# Patient Record
Sex: Female | Born: 1995 | Race: Black or African American | Hispanic: No | Marital: Single | Smoking: Never smoker
Health system: Southern US, Community
[De-identification: ages and names within clinical notes are randomized; demographics above are authoritative.]

## PROBLEM LIST (undated history)

## (undated) DIAGNOSIS — J45909 Unspecified asthma, uncomplicated: Secondary | ICD-10-CM

## (undated) HISTORY — DX: Unspecified asthma, uncomplicated: J45.909

---
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
## 2016-10-10 DIAGNOSIS — J45909 Unspecified asthma, uncomplicated: Secondary | ICD-10-CM | POA: Insufficient documentation

## 2016-11-21 DIAGNOSIS — J302 Other seasonal allergic rhinitis: Secondary | ICD-10-CM | POA: Insufficient documentation

## 2020-05-04 ENCOUNTER — Ambulatory Visit: Payer: Medicaid Other

## 2020-05-04 DIAGNOSIS — Z3490 Encounter for supervision of normal pregnancy, unspecified, unspecified trimester: Secondary | ICD-10-CM | POA: Insufficient documentation

## 2020-05-04 HISTORY — DX: Encounter for supervision of normal pregnancy, unspecified, unspecified trimester: Z34.90

## 2020-05-04 MED ORDER — BLOOD PRESSURE MONITOR KIT: 1.0000 | PACK | 0 refills | Status: DC

## 2020-05-04 NOTE — Progress Notes (Signed)
PRENATAL INTAKE SUMMARY  Autumn Morgan presents today New OB Nurse Interview.  OB History   No obstetric history on file.    I have reviewed the patient's medical, obstetrical, social, and family histories, medications, and available lab results.  SUBJECTIVE She has no unusual complaints.  OBJECTIVE Initial Physical Exam (New OB)     ASSESSMENT Normal pregnancy  PLAN Prenatal care B/P Cuff

## 2020-05-10 NOTE — Progress Notes (Signed)
Patient ID: Autumn Morgan, female   DOB: Feb 03, 1996, 24 y.o.   MRN: [id number] Patient was assessed and managed by nursing staff during this encounter. I have reviewed the chart and agree with the documentation and plan. I have also made any necessary editorial changes.  Scheryl Darter, MD 05/10/2020 11:42 PM

## 2020-05-11 ENCOUNTER — Encounter: Payer: Self-pay | Admitting: Obstetrics

## 2020-05-11 ENCOUNTER — Other Ambulatory Visit (HOSPITAL_COMMUNITY)
Admission: RE | Admit: 2020-05-11 | Discharge: 2020-05-11 | Disposition: A | Discharge status: Home / Self Care | Payer: BC Managed Care – PPO | Source: Ambulatory Visit | Attending: Obstetrics | Admitting: Obstetrics

## 2020-05-11 ENCOUNTER — Other Ambulatory Visit: Payer: Self-pay

## 2020-05-11 ENCOUNTER — Ambulatory Visit (INDEPENDENT_AMBULATORY_CARE_PROVIDER_SITE_OTHER): Payer: BC Managed Care – PPO | Admitting: Obstetrics

## 2020-05-11 VITALS — BP 107/66 | HR 65 | Ht 69.0 in | Wt 174.0 lb

## 2020-05-11 DIAGNOSIS — Z3490 Encounter for supervision of normal pregnancy, unspecified, unspecified trimester: Secondary | ICD-10-CM

## 2020-05-11 DIAGNOSIS — Z3482 Encounter for supervision of other normal pregnancy, second trimester: Secondary | ICD-10-CM

## 2020-05-11 DIAGNOSIS — Z3A14 14 weeks gestation of pregnancy: Secondary | ICD-10-CM

## 2020-05-11 MED ORDER — VITAFOL ULTRA 29-0.6-0.4-200 MG PO CAPS: 1.0000 | ORAL_CAPSULE | Freq: Every day | ORAL | 4 refills | Status: DC

## 2020-05-11 NOTE — Progress Notes (Signed)
New OB, c/o frequent headaches 3-7/10 x 2 weeks, nausea.

## 2020-05-11 NOTE — Progress Notes (Signed)
Subjective:  Autumn Morgan is a 24 y.o. G4P0030 at [redacted]w[redacted]d being seen today for ongoing prenatal care.  She is currently monitored for the following issues for this low-risk pregnancy and has ABSCESS, PULMONARY and Supervision of normal pregnancy, antepartum on their problem list.  Patient reports no complaints.  Contractions: Not present. Vag. Bleeding: None.   . Denies leaking of fluid.   The following portions of the patient's history were reviewed and updated as appropriate: allergies, current medications, past family history, past medical history, past social history, past surgical history and problem list. Problem list updated.  Objective:   Vitals:   05/11/20 0927 05/11/20 0936  BP: 107/66   Pulse: 65   Weight: 174 lb (78.9 kg)   Height:  5\' 9"  (1.753 m)    Fetal Status:           General:  Alert, oriented and cooperative. Patient is in no acute distress.  Skin: Skin is warm and dry. No rash noted.   Cardiovascular: Normal heart rate noted  Respiratory: Normal respiratory effort, no problems with respiration noted  Abdomen: Soft, gravid, appropriate for gestational age. Pain/Pressure: Absent     Pelvic:  Cervical exam deferred        Extremities: Normal range of motion.  Edema: None  Mental Status: Normal mood and affect. Normal behavior. Normal judgment and thought content.   Urinalysis:      Assessment and Plan:  Pregnancy: G4P0030 at [redacted]w[redacted]d  1. Encounter for supervision of normal pregnancy, antepartum, unspecified gravidity Rx: - Cytology - PAP( [place]) - Cervicovaginal ancillary only( [place]) - Enroll Patient in Babyscripts - Babyscripts Schedule Optimization - Genetic Screening - Culture, OB Urine - CBC/D/Plt+RPR+Rh+ABO+Rub Ab... - [redacted]w[redacted]d MFM OB COMP + 14 WK; Future  Preterm labor symptoms and general obstetric precautions including but not limited to vaginal bleeding, contractions, leaking of fluid and fetal movement were reviewed in detail with the  patient. Please refer to After Visit Summary for other counseling recommendations.   Return in about 4 weeks (around 06/08/2020) for MyChart.   06/10/2020, MD  05/11/20

## 2020-05-11 NOTE — Addendum Note (Signed)
Addended by: Coral Ceo A on: 05/11/2020 10:12 AM   Modules accepted: Orders

## 2020-05-12 LAB — CBC/D/PLT+RPR+RH+ABO+RUB AB...
Antibody Screen: NEGATIVE
Basophils Absolute: 0 10*3/uL (ref 0.0–0.2)
Basos: 0 %
EOS (ABSOLUTE): 0.1 10*3/uL (ref 0.0–0.4)
Eos: 2 %
HCV Ab: 0.2 s/co ratio (ref 0.0–0.9)
HIV Screen 4th Generation wRfx: NONREACTIVE
Hematocrit: 32.3 % — ABNORMAL LOW (ref 34.0–46.6)
Hemoglobin: 10.3 g/dL — ABNORMAL LOW (ref 11.1–15.9)
Hepatitis B Surface Ag: NEGATIVE
Immature Grans (Abs): 0 10*3/uL (ref 0.0–0.1)
Immature Granulocytes: 1 %
Lymphocytes Absolute: 1.6 10*3/uL (ref 0.7–3.1)
Lymphs: 25 %
MCH: 26.9 pg (ref 26.6–33.0)
MCHC: 31.9 g/dL (ref 31.5–35.7)
MCV: 84 fL (ref 79–97)
Monocytes Absolute: 0.4 10*3/uL (ref 0.1–0.9)
Monocytes: 6 %
Neutrophils Absolute: 4.2 10*3/uL (ref 1.4–7.0)
Neutrophils: 66 %
Platelets: 266 10*3/uL (ref 150–450)
RBC: 3.83 x10E6/uL (ref 3.77–5.28)
RDW: 17.9 % — ABNORMAL HIGH (ref 11.7–15.4)
RPR Ser Ql: NONREACTIVE
Rh Factor: NEGATIVE
Rubella Antibodies, IGG: 15.1 index (ref >0.99)
WBC: 6.4 10*3/uL (ref 3.4–10.8)

## 2020-05-12 LAB — CERVICOVAGINAL ANCILLARY ONLY
Bacterial Vaginitis (gardnerella): NEGATIVE
Candida Glabrata: NEGATIVE
Candida Vaginitis: NEGATIVE
Chlamydia: NEGATIVE
Comment: NEGATIVE
Comment: NEGATIVE
Comment: NEGATIVE
Comment: NEGATIVE
Comment: NEGATIVE
Comment: NORMAL
Neisseria Gonorrhea: NEGATIVE
Trichomonas: NEGATIVE

## 2020-05-12 LAB — HCV INTERPRETATION

## 2020-05-13 ENCOUNTER — Other Ambulatory Visit: Payer: Self-pay | Admitting: Obstetrics

## 2020-05-13 ENCOUNTER — Telehealth: Payer: Self-pay

## 2020-05-13 DIAGNOSIS — O99019 Anemia complicating pregnancy, unspecified trimester: Secondary | ICD-10-CM

## 2020-05-13 MED ORDER — FERROUS SULFATE 325 (65 FE) MG PO TABS: 325.0000 mg | ORAL_TABLET | Freq: Two times a day (BID) | ORAL | 5 refills | Status: DC

## 2020-05-13 NOTE — Telephone Encounter (Signed)
S/w pt and advised of results and rx sent. 

## 2020-05-14 LAB — CYTOLOGY - PAP
Comment: NEGATIVE
Diagnosis: NEGATIVE
Diagnosis: REACTIVE
High risk HPV: NEGATIVE

## 2020-05-17 LAB — URINE CULTURE, OB REFLEX

## 2020-05-17 LAB — CULTURE, OB URINE

## 2020-05-18 ENCOUNTER — Encounter: Payer: Self-pay | Admitting: Obstetrics

## 2020-05-19 ENCOUNTER — Other Ambulatory Visit: Payer: Self-pay | Admitting: Obstetrics

## 2020-05-19 DIAGNOSIS — B951 Streptococcus, group B, as the cause of diseases classified elsewhere: Secondary | ICD-10-CM

## 2020-05-19 DIAGNOSIS — O2340 Unspecified infection of urinary tract in pregnancy, unspecified trimester: Secondary | ICD-10-CM

## 2020-05-19 HISTORY — DX: Unspecified infection of urinary tract in pregnancy, unspecified trimester: O23.40

## 2020-05-19 HISTORY — DX: Streptococcus, group b, as the cause of diseases classified elsewhere: B95.1

## 2020-05-20 ENCOUNTER — Other Ambulatory Visit: Payer: Self-pay | Admitting: Obstetrics

## 2020-05-22 ENCOUNTER — Telehealth: Payer: Self-pay | Admitting: Obstetrics

## 2020-05-22 ENCOUNTER — Other Ambulatory Visit: Payer: Self-pay

## 2020-05-22 NOTE — Telephone Encounter (Signed)
Called patient to discuss allergy to PCN.  She says that her mother told her that she had a rash with PCN as a baby and should not take it, but she has been given Amoxicillin without any reaction.  Brock Bad, MD 05/22/2020 12:07 PM

## 2020-05-24 ENCOUNTER — Other Ambulatory Visit: Payer: Self-pay

## 2020-05-24 MED ORDER — AMOXICILLIN 500 MG PO CAPS: 500.0000 mg | ORAL_CAPSULE | Freq: Three times a day (TID) | ORAL | 0 refills | Status: DC

## 2020-06-08 ENCOUNTER — Encounter: Payer: Self-pay | Admitting: Nurse Practitioner

## 2020-06-08 ENCOUNTER — Telehealth (INDEPENDENT_AMBULATORY_CARE_PROVIDER_SITE_OTHER): Payer: Medicaid Other | Admitting: Nurse Practitioner

## 2020-06-08 DIAGNOSIS — Z3A18 18 weeks gestation of pregnancy: Secondary | ICD-10-CM

## 2020-06-08 DIAGNOSIS — Z3480 Encounter for supervision of other normal pregnancy, unspecified trimester: Secondary | ICD-10-CM

## 2020-06-08 DIAGNOSIS — Z3482 Encounter for supervision of other normal pregnancy, second trimester: Secondary | ICD-10-CM

## 2020-06-08 NOTE — Progress Notes (Signed)
S/w pt for virtual visit. Pt reports fetal movement with occasional back pain. Pt has not picked up BP cuff yet.

## 2020-06-08 NOTE — Progress Notes (Signed)
   OBSTETRICS PRENATAL VIRTUAL VISIT ENCOUNTER NOTE  Provider location: Center for [facility] [place]   I connected with Autumn Morgan on 06/08/20 at  2:00 PM EDT by MyChart Video Encounter at home and verified that I am speaking with the correct person using two identifiers.   I discussed the limitations, risks, security and privacy concerns of performing an evaluation and management service virtually and the availability of in person appointments. I also discussed with the patient that there may be a patient responsible charge related to this service. The patient expressed understanding and agreed to proceed. Subjective:  Autumn Morgan is a 24 y.o. G4P0030 at [redacted]w[redacted]d being seen today for ongoing prenatal care.  She is currently monitored for the following issues for this low-risk pregnancy and has ABSCESS, PULMONARY; Supervision of normal pregnancy, antepartum; GBS (group B streptococcus) UTI complicating pregnancy; Asthma; and Chronic seasonal allergic rhinitis on their problem list.  Patient reports decreased appetite.  Contractions: Not present. Vag. Bleeding: None.  Movement: Present. Denies any leaking of fluid.   The following portions of the patient's history were reviewed and updated as appropriate: allergies, current medications, past family history, past medical history, past social history, past surgical history and problem list.   Objective:  There were no vitals filed for this visit.  Fetal Status:     Movement: Present     General:  Alert, oriented and cooperative. Patient is in no acute distress.  Respiratory: Normal respiratory effort, no problems with respiration noted  Mental Status: Normal mood and affect. Normal behavior. Normal judgment and thought content.  Rest of physical exam deferred due to type of encounter  Imaging: No results found.  Assessment and Plan:  Pregnancy: G4P0030 at [redacted]w[redacted]d 1. Supervision of other normal pregnancy, antepartum Recommended OTC  hydrocortisone cream for bug bites on her feet Plan for AFP at next visit - in person for labs Reviewed [place] appointment to be seen in MyChart for later this week. Reviewed GBS treatment in labor Advised signing up for childbirth and breastfeeding classes at [email or web address] Does not yet have BP cuff - will get today Reviewed decreased appetite - is eating well - smaller amounts of food more freuqently recommended.  Preterm labor symptoms and general obstetric precautions including but not limited to vaginal bleeding, contractions, leaking of fluid and fetal movement were reviewed in detail with the patient. I discussed the assessment and treatment plan with the patient. The patient was provided an opportunity to ask questions and all were answered. The patient agreed with the plan and demonstrated an understanding of the instructions. The patient was advised to call back or seek an in-person office evaluation/go to [facility] at [facility] for any urgent or concerning symptoms. Please refer to After Visit Summary for other counseling recommendations.   I provided 8 minutes of face-to-face time during this encounter.  Return in about 4 weeks (around 07/06/2020) for in person ROB and AFP.  Future Appointments  Date Time Provider Department Center  06/11/2020  9:45 AM [facility] US[facility]    Currie Paris, NP Center for [employer], [facility]

## 2020-06-08 NOTE — Patient Instructions (Signed)
[email or web address] 

## 2020-06-11 ENCOUNTER — Ambulatory Visit: Payer: Medicaid Other | Attending: Obstetrics

## 2020-06-11 ENCOUNTER — Other Ambulatory Visit: Payer: Self-pay

## 2020-06-11 DIAGNOSIS — Z3490 Encounter for supervision of normal pregnancy, unspecified, unspecified trimester: Secondary | ICD-10-CM | POA: Diagnosis not present

## 2020-07-06 ENCOUNTER — Other Ambulatory Visit: Payer: Self-pay

## 2020-07-06 ENCOUNTER — Ambulatory Visit (INDEPENDENT_AMBULATORY_CARE_PROVIDER_SITE_OTHER): Payer: Medicaid Other | Admitting: Advanced Practice Midwife

## 2020-07-06 ENCOUNTER — Encounter: Payer: Self-pay | Admitting: Advanced Practice Midwife

## 2020-07-06 ENCOUNTER — Other Ambulatory Visit (HOSPITAL_COMMUNITY)
Admission: RE | Admit: 2020-07-06 | Discharge: 2020-07-06 | Disposition: A | Discharge status: Home / Self Care | Payer: BC Managed Care – PPO | Source: Ambulatory Visit | Attending: Advanced Practice Midwife | Admitting: Advanced Practice Midwife

## 2020-07-06 VITALS — BP 117/74 | HR 73 | Wt 192.0 lb

## 2020-07-06 DIAGNOSIS — O99019 Anemia complicating pregnancy, unspecified trimester: Secondary | ICD-10-CM

## 2020-07-06 DIAGNOSIS — N898 Other specified noninflammatory disorders of vagina: Secondary | ICD-10-CM

## 2020-07-06 DIAGNOSIS — O4442 Low lying placenta NOS or without hemorrhage, second trimester: Secondary | ICD-10-CM

## 2020-07-06 DIAGNOSIS — O26892 Other specified pregnancy related conditions, second trimester: Secondary | ICD-10-CM | POA: Insufficient documentation

## 2020-07-06 DIAGNOSIS — Z3A22 22 weeks gestation of pregnancy: Secondary | ICD-10-CM

## 2020-07-06 DIAGNOSIS — Z3480 Encounter for supervision of other normal pregnancy, unspecified trimester: Secondary | ICD-10-CM

## 2020-07-06 NOTE — Progress Notes (Signed)
Pt presents for ROB  Pt c/o "musty" vaginal odor and white  discharge.   Flu vaccine offered, pt requests to wait til next visit

## 2020-07-06 NOTE — Patient Instructions (Addendum)
Some natural remedies/prevention to try for bacterial vaginosis: --Take a probiotic tablet/capsule every day for at least 1-2 months.   --Whenever you have symptoms, use boric acid suppositories vaginally every night for a week.   --Do not use scented soaps/perfumes in the vaginal area, and do not overwash multiple times daily. --Wear breathable cotton underwear and do not wear tight restrictive clothing. --Limit pantyliner use, change your underwear several times daily instead. --Use condoms during intercourse.     Second Trimester of Pregnancy The second trimester is from week 14 through week 27 (months 4 through 6). The second trimester is often a time when you feel your best. Your body has adjusted to being pregnant, and you begin to feel better physically. Usually, morning sickness has lessened or quit completely, you may have more energy, and you may have an increase in appetite. The second trimester is also a time when the fetus is growing rapidly. At the end of the sixth month, the fetus is about 9 inches long and weighs about 1 pounds. You will likely begin to feel the baby move (quickening) between 16 and 20 weeks of pregnancy. Body changes during your second trimester Your body continues to go through many changes during your second trimester. The changes vary from woman to woman.  Your weight will continue to increase. You will notice your lower abdomen bulging out.  You may begin to get stretch marks on your hips, abdomen, and breasts.  You may develop headaches that can be relieved by medicines. The medicines should be approved by your health care provider.  You may urinate more often because the fetus is pressing on your bladder.  You may develop or continue to have heartburn as a result of your pregnancy.  You may develop constipation because certain hormones are causing the muscles that push waste through your intestines to slow down.  You may develop hemorrhoids or  swollen, bulging veins (varicose veins).  You may have back pain. This is caused by: ? Weight gain. ? Pregnancy hormones that are relaxing the joints in your pelvis. ? A shift in weight and the muscles that support your balance.  Your breasts will continue to grow and they will continue to become tender.  Your gums may bleed and may be sensitive to brushing and flossing.  Dark spots or blotches (chloasma, mask of pregnancy) may develop on your face. This will likely fade after the baby is born.  A dark line from your belly button to the pubic area (linea nigra) may appear. This will likely fade after the baby is born.  You may have changes in your hair. These can include thickening of your hair, rapid growth, and changes in texture. Some women also have hair loss during or after pregnancy, or hair that feels dry or thin. Your hair will most likely return to normal after your baby is born. What to expect at prenatal visits During a routine prenatal visit:  You will be weighed to make sure you and the fetus are growing normally.  Your blood pressure will be taken.  Your abdomen will be measured to track your baby's growth.  The fetal heartbeat will be listened to.  Any test results from the previous visit will be discussed. Your health care provider may ask you:  How you are feeling.  If you are feeling the baby move.  If you have had any abnormal symptoms, such as leaking fluid, bleeding, severe headaches, or abdominal cramping.  If you are  using any tobacco products, including cigarettes, chewing tobacco, and electronic cigarettes.  If you have any questions. Other tests that may be performed during your second trimester include:  Blood tests that check for: ? Low iron levels (anemia). ? High blood sugar that affects pregnant women (gestational diabetes) between 56 and 28 weeks. ? Rh antibodies. This is to check for a protein on red blood cells (Rh factor).  Urine tests  to check for infections, diabetes, or protein in the urine.  An ultrasound to confirm the proper growth and development of the baby.  An amniocentesis to check for possible genetic problems.  Fetal screens for spina bifida and Down syndrome.  HIV (human immunodeficiency virus) testing. Routine prenatal testing includes screening for HIV, unless you choose not to have this test. Follow these instructions at home: Medicines  Follow your health care provider's instructions regarding medicine use. Specific medicines may be either safe or unsafe to take during pregnancy.  Take a prenatal vitamin that contains at least 600 micrograms (mcg) of folic acid.  If you develop constipation, try taking a stool softener if your health care provider approves. Eating and drinking   Eat a balanced diet that includes fresh fruits and vegetables, whole grains, good sources of protein such as meat, eggs, or tofu, and low-fat dairy. Your health care provider will help you determine the amount of weight gain that is right for you.  Avoid raw meat and uncooked cheese. These carry germs that can cause birth defects in the baby.  If you have low calcium intake from food, talk to your health care provider about whether you should take a daily calcium supplement.  Limit foods that are high in fat and processed sugars, such as fried and sweet foods.  To prevent constipation: ? Drink enough fluid to keep your urine clear or pale yellow. ? Eat foods that are high in fiber, such as fresh fruits and vegetables, whole grains, and beans. Activity  Exercise only as directed by your health care provider. Most women can continue their usual exercise routine during pregnancy. Try to exercise for 30 minutes at least 5 days a week. Stop exercising if you experience uterine contractions.  Avoid heavy lifting, wear low heel shoes, and practice good posture.  A sexual relationship may be continued unless your health care  provider directs you otherwise. Relieving pain and discomfort  Wear a good support bra to prevent discomfort from breast tenderness.  Take warm sitz baths to soothe any pain or discomfort caused by hemorrhoids. Use hemorrhoid cream if your health care provider approves.  Rest with your legs elevated if you have leg cramps or low back pain.  If you develop varicose veins, wear support hose. Elevate your feet for 15 minutes, 3-4 times a day. Limit salt in your diet. Prenatal Care  Write down your questions. Take them to your prenatal visits.  Keep all your prenatal visits as told by your health care provider. This is important. Safety  Wear your seat belt at all times when driving.  Make a list of emergency phone numbers, including numbers for family, friends, the hospital, and police and fire departments. General instructions  Ask your health care provider for a referral to a local prenatal education class. Begin classes no later than the beginning of month 6 of your pregnancy.  Ask for help if you have counseling or nutritional needs during pregnancy. Your health care provider can offer advice or refer you to specialists for help with  various needs.  Do not use hot tubs, steam rooms, or saunas.  Do not douche or use tampons or scented sanitary pads.  Do not cross your legs for long periods of time.  Avoid cat litter boxes and soil used by cats. These carry germs that can cause birth defects in the baby and possibly loss of the fetus by miscarriage or stillbirth.  Avoid all smoking, herbs, alcohol, and unprescribed drugs. Chemicals in these products can affect the formation and growth of the baby.  Do not use any products that contain nicotine or tobacco, such as cigarettes and e-cigarettes. If you need help quitting, ask your health care provider.  Visit your dentist if you have not gone yet during your pregnancy. Use a soft toothbrush to brush your teeth and be gentle when you  floss. Contact a health care provider if:  You have dizziness.  You have mild pelvic cramps, pelvic pressure, or nagging pain in the abdominal area.  You have persistent nausea, vomiting, or diarrhea.  You have a bad smelling vaginal discharge.  You have pain when you urinate. Get help right away if:  You have a fever.  You are leaking fluid from your vagina.  You have spotting or bleeding from your vagina.  You have severe abdominal cramping or pain.  You have rapid weight gain or weight loss.  You have shortness of breath with chest pain.  You notice sudden or extreme swelling of your face, hands, ankles, feet, or legs.  You have not felt your baby move in over an hour.  You have severe headaches that do not go away when you take medicine.  You have vision changes. Summary  The second trimester is from week 14 through week 27 (months 4 through 6). It is also a time when the fetus is growing rapidly.  Your body goes through many changes during pregnancy. The changes vary from woman to woman.  Avoid all smoking, herbs, alcohol, and unprescribed drugs. These chemicals affect the formation and growth your baby.  Do not use any tobacco products, such as cigarettes, chewing tobacco, and e-cigarettes. If you need help quitting, ask your health care provider.  Contact your health care provider if you have any questions. Keep all prenatal visits as told by your health care provider. This is important. This information is not intended to replace advice given to you by your health care provider. Make sure you discuss any questions you have with your health care provider. Document Revised: 01/04/2019 Document Reviewed: 10/18/2016 Elsevier Patient Education  2020 [employer].

## 2020-07-06 NOTE — Progress Notes (Addendum)
° °  PRENATAL VISIT NOTE  Subjective:  Autumn Morgan is a 24 y.o. G4P0030 at [redacted]w[redacted]d being seen today for ongoing prenatal care.  She is currently monitored for the following issues for this low-risk pregnancy and has ABSCESS, PULMONARY; Supervision of normal pregnancy, antepartum; GBS (group B streptococcus) UTI complicating pregnancy; Asthma; and Chronic seasonal allergic rhinitis on their problem list.  Patient reports vaginal discharge.  Contractions: Not present. Vag. Bleeding: None.  Movement: Present. Denies leaking of fluid.   The following portions of the patient's history were reviewed and updated as appropriate: allergies, current medications, past family history, past medical history, past social history, past surgical history and problem list.   Objective:   Vitals:   07/06/20 1527  BP: 117/74  Pulse: 73  Weight: 192 lb (87.1 kg)    Fetal Status: Fetal Heart Rate (bpm): 151 Fundal Height: 22 cm Movement: Present     General:  Alert, oriented and cooperative. Patient is in no acute distress.  Skin: Skin is warm and dry. No rash noted.   Cardiovascular: Normal heart rate noted  Respiratory: Normal respiratory effort, no problems with respiration noted  Abdomen: Soft, gravid, appropriate for gestational age.  Pain/Pressure: Present     Pelvic: Cervical exam deferred        Extremities: Normal range of motion.  Edema: None  Mental Status: Normal mood and affect. Normal behavior. Normal judgment and thought content.   Assessment and Plan:  Pregnancy: G4P0030 at [redacted]w[redacted]d 1. Supervision of other normal pregnancy, antepartum --Anticipatory guidance about next visits/weeks of pregnancy given. --Next visit in 4 weeks in office for GTT  2. Vaginal discharge during pregnancy in second trimester --Pt noticed a musky smell with discharge, has had BV in the past --Discussed prevention of vaginal infections including probiotics, breathable cotton underwear, and decreased washing/use of  soaps - Cervicovaginal ancillary only( )  3. Anemia affecting pregnancy, antepartum --Taking oral iron  4. Low-lying placenta in second trimester --No evidence of accreta, but placenta anterior and low, near C/S incision. --Will continue to evaluate - [place] MFM OB FOLLOW UP; Future  5. [redacted] weeks gestation of pregnancy  Preterm labor symptoms and general obstetric precautions including but not limited to vaginal bleeding, contractions, leaking of fluid and fetal movement were reviewed in detail with the patient. Please refer to After Visit Summary for other counseling recommendations.   Return in about 4 weeks (around 08/03/2020).  Future Appointments  Date Time Provider Department Center  08/04/2020  9:15 AM [facility] None  08/04/2020  9:35 AM [place], Brand Males, NP [facility] None    Sharen Counter, CNM

## 2020-07-06 NOTE — Addendum Note (Signed)
Addended by: Dalphine Handing on: 07/06/2020 04:19 PM   Modules accepted: Orders

## 2020-07-07 LAB — CERVICOVAGINAL ANCILLARY ONLY
Bacterial Vaginitis (gardnerella): NEGATIVE
Candida Glabrata: NEGATIVE
Candida Vaginitis: NEGATIVE
Comment: NEGATIVE
Comment: NEGATIVE
Comment: NEGATIVE

## 2020-07-27 ENCOUNTER — Telehealth: Payer: Self-pay

## 2020-07-27 NOTE — Telephone Encounter (Signed)
Headaches and nausea and vomiting. Pt advised on comfort measures Increase water intake continue tylenol.  Pt is going to check B/P once she gets home. Pt advised to stop PNV's or take at night Bland diet, eat small frequent meals, ginger ale Pt offered Rx declined wants to try comfort measures first.  Pt advised to let us know if sx's become worse. Pt agreeable.

## 2020-08-04 ENCOUNTER — Other Ambulatory Visit: Payer: Self-pay

## 2020-08-04 ENCOUNTER — Ambulatory Visit (INDEPENDENT_AMBULATORY_CARE_PROVIDER_SITE_OTHER): Payer: BC Managed Care – PPO | Admitting: Nurse Practitioner

## 2020-08-04 ENCOUNTER — Other Ambulatory Visit: Payer: Medicaid Other

## 2020-08-04 ENCOUNTER — Encounter: Payer: Self-pay | Admitting: Nurse Practitioner

## 2020-08-04 VITALS — BP 116/75 | HR 83 | Wt 197.8 lb

## 2020-08-04 DIAGNOSIS — Z23 Encounter for immunization: Secondary | ICD-10-CM | POA: Diagnosis not present

## 2020-08-04 DIAGNOSIS — Z3A26 26 weeks gestation of pregnancy: Secondary | ICD-10-CM

## 2020-08-04 DIAGNOSIS — Z3480 Encounter for supervision of other normal pregnancy, unspecified trimester: Secondary | ICD-10-CM

## 2020-08-04 NOTE — Progress Notes (Signed)
ROB 2 hr GTT Due today.  T-Dap Given w/o any problems.   CC: None

## 2020-08-04 NOTE — Progress Notes (Signed)
    Subjective:  Autumn Morgan is a 24 y.o. G4P0030 at [redacted]w[redacted]d being seen today for ongoing prenatal care.  She is currently monitored for the following issues for this low-risk pregnancy and has ABSCESS, PULMONARY; Supervision of normal pregnancy, antepartum; GBS (group B streptococcus) UTI complicating pregnancy; Asthma; and Chronic seasonal allergic rhinitis on their problem list.  Patient reports no complaints.  Contractions: Not present. Vag. Bleeding: None.  Movement: Present. Denies leaking of fluid.   The following portions of the patient's history were reviewed and updated as appropriate: allergies, current medications, past family history, past medical history, past social history, past surgical history and problem list. Problem list updated.  Objective:   Vitals:   08/04/20 0928  BP: 116/75  Pulse: 83  Weight: 197 lb 12.8 oz (89.7 kg)    Fetal Status: Fetal Heart Rate (bpm): 150 Fundal Height: 27 cm Movement: Present     General:  Alert, oriented and cooperative. Patient is in no acute distress.  Skin: Skin is warm and dry. No rash noted.   Cardiovascular: Normal heart rate noted  Respiratory: Normal respiratory effort, no problems with respiration noted  Abdomen: Soft, gravid, appropriate for gestational age. Pain/Pressure: Absent     Pelvic:  Cervical exam deferred        Extremities: Normal range of motion.  Edema: None  Mental Status: Normal mood and affect. Normal behavior. Normal judgment and thought content.   Urinalysis:      Assessment and Plan:  Pregnancy: G4P0030 at [redacted]w[redacted]d  1. Supervision of other normal pregnancy, antepartum Wants TDAP today Has had Covid Vaccine Is signed up for breastfeeding, childbirth and infant safety classes  - CBC - Glucose Tolerance, 2 Hours w/1 Hour - RPR - HIV Antibody (routine testing w rflx)  2. [redacted] weeks gestation of pregnancy   Preterm labor symptoms and general obstetric precautions including but not limited to vaginal  bleeding, contractions, leaking of fluid and fetal movement were reviewed in detail with the patient. Please refer to After Visit Summary for other counseling recommendations.  Return in about 2 weeks (around 08/18/2020) for in person ROB.  Nolene Bernheim, RN, MSN, NP-BC Nurse Practitioner, [facility] for [employer], [facility] 08/04/2020 10:12 AM

## 2020-08-05 ENCOUNTER — Other Ambulatory Visit: Payer: Self-pay | Admitting: *Deleted

## 2020-08-05 ENCOUNTER — Ambulatory Visit: Payer: BC Managed Care – PPO | Attending: Advanced Practice Midwife

## 2020-08-05 DIAGNOSIS — O4442 Low lying placenta NOS or without hemorrhage, second trimester: Secondary | ICD-10-CM | POA: Insufficient documentation

## 2020-08-05 DIAGNOSIS — Q699 Polydactyly, unspecified: Secondary | ICD-10-CM

## 2020-08-05 DIAGNOSIS — O358XX0 Maternal care for other (suspected) fetal abnormality and damage, not applicable or unspecified: Secondary | ICD-10-CM | POA: Diagnosis not present

## 2020-08-05 DIAGNOSIS — Z363 Encounter for antenatal screening for malformations: Secondary | ICD-10-CM

## 2020-08-05 DIAGNOSIS — Z3A26 26 weeks gestation of pregnancy: Secondary | ICD-10-CM

## 2020-08-05 LAB — RPR: RPR Ser Ql: NONREACTIVE

## 2020-08-05 LAB — CBC
Hematocrit: 35.2 % (ref 34.0–46.6)
Hemoglobin: 11.7 g/dL (ref 11.1–15.9)
MCH: 30.9 pg (ref 26.6–33.0)
MCHC: 33.2 g/dL (ref 31.5–35.7)
MCV: 93 fL (ref 79–97)
Platelets: 214 10*3/uL (ref 150–450)
RBC: 3.79 x10E6/uL (ref 3.77–5.28)
RDW: 14.8 % (ref 11.7–15.4)
WBC: 7.8 10*3/uL (ref 3.4–10.8)

## 2020-08-05 LAB — GLUCOSE TOLERANCE, 2 HOURS W/ 1HR
Glucose, 1 hour: 85 mg/dL (ref 65–179)
Glucose, 2 hour: 73 mg/dL (ref 65–152)
Glucose, Fasting: 76 mg/dL (ref 65–91)

## 2020-08-05 LAB — HIV ANTIBODY (ROUTINE TESTING W REFLEX): HIV Screen 4th Generation wRfx: NONREACTIVE

## 2020-08-08 ENCOUNTER — Encounter: Payer: Self-pay | Admitting: Advanced Practice Midwife

## 2020-08-08 DIAGNOSIS — IMO0002 Reserved for concepts with insufficient information to code with codable children: Secondary | ICD-10-CM

## 2020-08-08 DIAGNOSIS — O358XX0 Maternal care for other (suspected) fetal abnormality and damage, not applicable or unspecified: Secondary | ICD-10-CM

## 2020-08-08 HISTORY — DX: Reserved for concepts with insufficient information to code with codable children: IMO0002

## 2020-08-08 HISTORY — DX: Maternal care for other (suspected) fetal abnormality and damage, not applicable or unspecified: O35.8XX0

## 2020-08-18 ENCOUNTER — Ambulatory Visit (INDEPENDENT_AMBULATORY_CARE_PROVIDER_SITE_OTHER): Payer: BC Managed Care – PPO | Admitting: Certified Nurse Midwife

## 2020-08-18 ENCOUNTER — Other Ambulatory Visit: Payer: Self-pay

## 2020-08-18 ENCOUNTER — Encounter: Payer: Self-pay | Admitting: Certified Nurse Midwife

## 2020-08-18 VITALS — BP 105/71 | HR 82 | Wt 204.0 lb

## 2020-08-18 DIAGNOSIS — Z3A28 28 weeks gestation of pregnancy: Secondary | ICD-10-CM

## 2020-08-18 DIAGNOSIS — Z6791 Unspecified blood type, Rh negative: Secondary | ICD-10-CM

## 2020-08-18 DIAGNOSIS — Z3480 Encounter for supervision of other normal pregnancy, unspecified trimester: Secondary | ICD-10-CM

## 2020-08-18 DIAGNOSIS — O4440 Low lying placenta NOS or without hemorrhage, unspecified trimester: Secondary | ICD-10-CM | POA: Insufficient documentation

## 2020-08-18 DIAGNOSIS — O26899 Other specified pregnancy related conditions, unspecified trimester: Secondary | ICD-10-CM | POA: Diagnosis not present

## 2020-08-18 HISTORY — DX: Unspecified blood type, rh negative: Z67.91

## 2020-08-18 HISTORY — DX: Low lying placenta nos or without hemorrhage, unspecified trimester: O44.40

## 2020-08-18 MED ORDER — RHO D IMMUNE GLOBULIN 1500 UNIT/2ML IJ SOSY
300.0000 ug | PREFILLED_SYRINGE | Freq: Once | INTRAMUSCULAR | Status: AC
  Administered 2020-08-18: 300 ug via INTRAMUSCULAR

## 2020-08-18 NOTE — Patient Instructions (Addendum)
[facility]   Third Trimester of Pregnancy  The third trimester is from week 28 through week 40 (months 7 through 9). This trimester is when your unborn baby (fetus) is growing very fast. At the end of the ninth month, the unborn baby is about 20 inches in length. It weighs about 6-10 pounds. Follow these instructions at home: Medicines  Take over-the-counter and prescription medicines only as told by your doctor. Some medicines are safe and some medicines are not safe during pregnancy.  Take a prenatal vitamin that contains at least 600 micrograms (mcg) of folic acid.  If you have trouble pooping (constipation), take medicine that will make your stool soft (stool softener) if your doctor approves. Eating and drinking   Eat regular, healthy meals.  Avoid raw meat and uncooked cheese.  If you get low calcium from the food you eat, talk to your doctor about taking a daily calcium supplement.  Eat four or five small meals rather than three large meals a day.  Avoid foods that are high in fat and sugars, such as fried and sweet foods.  To prevent constipation: ? Eat foods that are high in fiber, like fresh fruits and vegetables, whole grains, and beans. ? Drink enough fluids to keep your pee (urine) clear or pale yellow. Activity  Exercise only as told by your doctor. Stop exercising if you start to have cramps.  Avoid heavy lifting, wear low heels, and sit up straight.  Do not exercise if it is too hot, too humid, or if you are in a place of great height (high altitude).  You may continue to have sex unless your doctor tells you not to. Relieving pain and discomfort  Wear a good support bra if your breasts are tender.  Take frequent breaks and rest with your legs raised if you have leg cramps or low back pain.  Take warm water baths (sitz baths) to soothe pain or discomfort caused by hemorrhoids. Use hemorrhoid cream if your doctor approves.  If you develop puffy,  bulging veins (varicose veins) in your legs: ? Wear support hose or compression stockings as told by your doctor. ? Raise (elevate) your feet for 15 minutes, 3-4 times a day. ? Limit salt in your food. Safety  Wear your seat belt when driving.  Make a list of emergency phone numbers, including numbers for family, friends, the hospital, and police and fire departments. Preparing for your baby's arrival To prepare for the arrival of your baby:  Take prenatal classes.  Practice driving to the hospital.  Visit the hospital and tour the maternity area.  Talk to your work about taking leave once the baby comes.  Pack your hospital bag.  Prepare the baby's room.  Go to your doctor visits.  Buy a rear-facing car seat. Learn how to install it in your car. General instructions  Do not use hot tubs, steam rooms, or saunas.  Do not use any products that contain nicotine or tobacco, such as cigarettes and e-cigarettes. If you need help quitting, ask your doctor.  Do not drink alcohol.  Do not douche or use tampons or scented sanitary pads.  Do not cross your legs for long periods of time.  Do not travel for long distances unless you must. Only do so if your doctor says it is okay.  Visit your dentist if you have not gone during your pregnancy. Use a soft toothbrush to brush your teeth. Be gentle when you floss.  Avoid cat  litter boxes and soil used by cats. These carry germs that can cause birth defects in the baby and can cause a loss of your baby (miscarriage) or stillbirth.  Keep all your prenatal visits as told by your doctor. This is important. Contact a doctor if:  You are not sure if you are in labor or if your water has broken.  You are dizzy.  You have mild cramps or pressure in your lower belly.  You have a nagging pain in your belly area.  You continue to feel sick to your stomach, you throw up, or you have watery poop.  You have bad smelling fluid coming from  your vagina.  You have pain when you pee. Get help right away if:  You have a fever.  You are leaking fluid from your vagina.  You are spotting or bleeding from your vagina.  You have severe belly cramps or pain.  You lose or gain weight quickly.  You have trouble catching your breath and have chest pain.  You notice sudden or extreme puffiness (swelling) of your face, hands, ankles, feet, or legs.  You have not felt the baby move in over an hour.  You have severe headaches that do not go away with medicine.  You have trouble seeing.  You are leaking, or you are having a gush of fluid, from your vagina before you are 37 weeks.  You have regular belly spasms (contractions) before you are 37 weeks. Summary  The third trimester is from week 28 through week 40 (months 7 through 9). This time is when your unborn baby is growing very fast.  Follow your doctor's advice about medicine, food, and activity.  Get ready for the arrival of your baby by taking prenatal classes, getting all the baby items ready, preparing the baby's room, and visiting your doctor to be checked.  Get help right away if you are bleeding from your vagina, or you have chest pain and trouble catching your breath, or if you have not felt your baby move in over an hour. This information is not intended to replace advice given to you by your health care provider. Make sure you discuss any questions you have with your health care provider. Document Revised: 01/03/2019 Document Reviewed: 10/18/2016 Elsevier Patient Education  2020 [employer].

## 2020-08-18 NOTE — Progress Notes (Addendum)
ROB, Rhogam given in LV, tolerated well.  Reports no problems today.  Administrations This Visit    rho (d) immune globulin (RHIG/RHOPHYLAC) injection 300 mcg    Admin Date 08/18/2020 Action Given Dose 300 mcg Route Intramuscular Administered By Maretta Bees, RMA

## 2020-08-18 NOTE — Progress Notes (Signed)
   PRENATAL VISIT NOTE  Subjective:  Autumn Morgan is a 24 y.o. G4P0030 at [redacted]w[redacted]d being seen today for ongoing prenatal care.  She is currently monitored for the following issues for this low-risk pregnancy and has ABSCESS, PULMONARY; Supervision of normal pregnancy, antepartum; GBS (group B streptococcus) UTI complicating pregnancy; Asthma; Chronic seasonal allergic rhinitis; Polydactyly, fetal, affecting care of mother, antepartum; Low-lying placenta; and Rh negative state in antepartum period on their problem list.  Patient reports backache.  Contractions: Not present. Vag. Bleeding: None.  Movement: Present. Denies leaking of fluid.   The following portions of the patient's history were reviewed and updated as appropriate: allergies, current medications, past family history, past medical history, past social history, past surgical history and problem list.   Objective:   Vitals:   08/18/20 0817  BP: 105/71  Pulse: 82  Weight: 204 lb (92.5 kg)    Fetal Status: Fetal Heart Rate (bpm): 149 Fundal Height: 27 cm Movement: Present     General:  Alert, oriented and cooperative. Patient is in no acute distress.  Skin: Skin is warm and dry. No rash noted.   Cardiovascular: Normal heart rate noted  Respiratory: Normal respiratory effort, no problems with respiration noted  Abdomen: Soft, gravid, appropriate for gestational age.  Pain/Pressure: Absent     Pelvic: Cervical exam deferred        Extremities: Normal range of motion.  Edema: Trace  Mental Status: Normal mood and affect. Normal behavior. Normal judgment and thought content.   Assessment and Plan:  Pregnancy: G4P0030 at [redacted]w[redacted]d 1. Supervision of other normal pregnancy, antepartum - Patient doing well, reports occasional lower back pain, patient reports that she currently does prenatal yoga to help relieve back pain  - Discussed and encouraged prenatal massages and [facility] to help with back pain as well  - Reviewed GTT  lab results with patient  - Educated and discussed warning signs and when to present to MAU including s/s of PEC - Routine prenatal care - Anticipatory guidance on upcoming appointments   2. Low-lying placenta - [place] on 11/10 noted anterior low lying placenta, 0.7cm from internal os  - Discussed warning signs with patient including bleeding and reasons to present to MAU  - follow up US scheduled for 12/7  3. [redacted] weeks gestation of pregnancy  4. Rh negative state in antepartum period - rho (d) immune globulin (RHIG/RHOPHYLAC) injection 300 mcg  Preterm labor symptoms and general obstetric precautions including but not limited to vaginal bleeding, contractions, leaking of fluid and fetal movement were reviewed in detail with the patient. Please refer to After Visit Summary for other counseling recommendations.   Return in about 2 weeks (around 09/01/2020) for LROB, in person.  Future Appointments  Date Time Provider Department Center  09/01/2020  8:35 AM Thressa Sheller D, CNM [facility] None  09/01/2020 10:45 AM [facility]URSE [facility]  09/01/2020 11:00 AM [facility] US1 [facility]    Sharyon Cable, CNM

## 2020-09-01 ENCOUNTER — Other Ambulatory Visit: Payer: Self-pay

## 2020-09-01 ENCOUNTER — Ambulatory Visit: Payer: BC Managed Care – PPO | Admitting: *Deleted

## 2020-09-01 ENCOUNTER — Ambulatory Visit (INDEPENDENT_AMBULATORY_CARE_PROVIDER_SITE_OTHER): Payer: Medicaid Other | Admitting: Advanced Practice Midwife

## 2020-09-01 ENCOUNTER — Encounter: Payer: Self-pay | Admitting: Advanced Practice Midwife

## 2020-09-01 ENCOUNTER — Encounter: Payer: Self-pay | Admitting: *Deleted

## 2020-09-01 ENCOUNTER — Ambulatory Visit: Payer: BC Managed Care – PPO | Attending: Obstetrics and Gynecology

## 2020-09-01 ENCOUNTER — Other Ambulatory Visit: Payer: Self-pay | Admitting: *Deleted

## 2020-09-01 VITALS — BP 125/72 | HR 94 | Wt 209.6 lb

## 2020-09-01 DIAGNOSIS — O358XX0 Maternal care for other (suspected) fetal abnormality and damage, not applicable or unspecified: Secondary | ICD-10-CM

## 2020-09-01 DIAGNOSIS — Z3A30 30 weeks gestation of pregnancy: Secondary | ICD-10-CM

## 2020-09-01 DIAGNOSIS — O360130 Maternal care for anti-D [Rh] antibodies, third trimester, not applicable or unspecified: Secondary | ICD-10-CM | POA: Diagnosis not present

## 2020-09-01 DIAGNOSIS — Z3480 Encounter for supervision of other normal pregnancy, unspecified trimester: Secondary | ICD-10-CM

## 2020-09-01 DIAGNOSIS — O4440 Low lying placenta NOS or without hemorrhage, unspecified trimester: Secondary | ICD-10-CM | POA: Diagnosis present

## 2020-09-01 DIAGNOSIS — O4403 Placenta previa specified as without hemorrhage, third trimester: Secondary | ICD-10-CM | POA: Diagnosis not present

## 2020-09-01 DIAGNOSIS — Q699 Polydactyly, unspecified: Secondary | ICD-10-CM | POA: Diagnosis present

## 2020-09-01 DIAGNOSIS — Z363 Encounter for antenatal screening for malformations: Secondary | ICD-10-CM | POA: Diagnosis not present

## 2020-09-01 NOTE — Progress Notes (Signed)
Pt presents for ROB Low lying placenta f/u u/s scheduled this morning

## 2020-09-01 NOTE — Progress Notes (Signed)
   PRENATAL VISIT NOTE  Subjective:  Autumn Morgan is a 24 y.o. G4P0030 at [redacted]w[redacted]d being seen today for ongoing prenatal care.  She is currently monitored for the following issues for this low-risk pregnancy and has ABSCESS, PULMONARY; Supervision of normal pregnancy, antepartum; GBS (group B streptococcus) UTI complicating pregnancy; Asthma; Chronic seasonal allergic rhinitis; Polydactyly, fetal, affecting care of mother, antepartum; Low-lying placenta; and Rh negative state in antepartum period on their problem list.  Patient reports no complaints.  Contractions: Not present. Vag. Bleeding: None.  Movement: Present. Denies leaking of fluid.   The following portions of the patient's history were reviewed and updated as appropriate: allergies, current medications, past family history, past medical history, past social history, past surgical history and problem list.   Objective:   Vitals:   09/01/20 0838  BP: 125/72  Pulse: 94  Weight: 209 lb 9.6 oz (95.1 kg)    Fetal Status: Fetal Heart Rate (bpm): 147 Fundal Height: 30 cm Movement: Present     General:  Alert, oriented and cooperative. Patient is in no acute distress.  Skin: Skin is warm and dry. No rash noted.   Cardiovascular: Normal heart rate noted  Respiratory: Normal respiratory effort, no problems with respiration noted  Abdomen: Soft, gravid, appropriate for gestational age.  Pain/Pressure: Absent     Pelvic: Cervical exam deferred        Extremities: Normal range of motion.  Edema: None  Mental Status: Normal mood and affect. Normal behavior. Normal judgment and thought content.   Assessment and Plan:  Pregnancy: G4P0030 at [redacted]w[redacted]d 1. [redacted] weeks gestation of pregnancy - routine care  2. Supervision of other normal pregnancy, antepartum   3. Low-lying placenta - 1cm from os on 08/05/2020 - Patient has FU [place] with MFM later today   Preterm labor symptoms and general obstetric precautions including but not limited to vaginal  bleeding, contractions, leaking of fluid and fetal movement were reviewed in detail with the patient. Please refer to After Visit Summary for other counseling recommendations.   Return in about 2 weeks (around 09/15/2020).  Future Appointments  Date Time Provider Department Center  09/01/2020 10:45 AM [facility] NURSE [facility]  09/01/2020 11:00 AM [facility] US1 [facility]  09/15/2020  4:15 PM Johnny Bridge, MD [facility] None    Thressa Sheller DNP, CNM  09/01/20  8:56 AM

## 2020-09-15 ENCOUNTER — Other Ambulatory Visit: Payer: Self-pay

## 2020-09-15 ENCOUNTER — Encounter: Payer: Self-pay | Admitting: Obstetrics and Gynecology

## 2020-09-15 ENCOUNTER — Ambulatory Visit (INDEPENDENT_AMBULATORY_CARE_PROVIDER_SITE_OTHER): Payer: BC Managed Care – PPO | Admitting: Obstetrics and Gynecology

## 2020-09-15 VITALS — BP 123/77 | HR 81 | Wt 211.0 lb

## 2020-09-15 DIAGNOSIS — Z3480 Encounter for supervision of other normal pregnancy, unspecified trimester: Secondary | ICD-10-CM

## 2020-09-15 DIAGNOSIS — O4440 Low lying placenta NOS or without hemorrhage, unspecified trimester: Secondary | ICD-10-CM

## 2020-09-15 DIAGNOSIS — Z6791 Unspecified blood type, Rh negative: Secondary | ICD-10-CM

## 2020-09-15 DIAGNOSIS — O358XX0 Maternal care for other (suspected) fetal abnormality and damage, not applicable or unspecified: Secondary | ICD-10-CM

## 2020-09-15 DIAGNOSIS — O26899 Other specified pregnancy related conditions, unspecified trimester: Secondary | ICD-10-CM

## 2020-09-15 DIAGNOSIS — IMO0001 Reserved for inherently not codable concepts without codable children: Secondary | ICD-10-CM

## 2020-09-15 NOTE — Progress Notes (Signed)
   PRENATAL VISIT NOTE  Subjective:  Autumn Morgan is a 24 y.o. G4P0030 at [redacted]w[redacted]d being seen today for ongoing prenatal care.  She is currently monitored for the following issues for this low-risk pregnancy and has ABSCESS, PULMONARY; Supervision of normal pregnancy, antepartum; GBS (group B streptococcus) UTI complicating pregnancy; Asthma; Chronic seasonal allergic rhinitis; Polydactyly, fetal, affecting care of mother, antepartum; Low-lying placenta; and Rh negative state in antepartum period on their problem list.  Patient reports no complaints.  Contractions: Irritability. Vag. Bleeding: None.  Movement: Present. Denies leaking of fluid.   The following portions of the patient's history were reviewed and updated as appropriate: allergies, current medications, past family history, past medical history, past social history, past surgical history and problem list.   Objective:   Vitals:   09/15/20 1524  BP: 123/77  Pulse: 81  Weight: 211 lb (95.7 kg)    Fetal Status: Fetal Heart Rate (bpm): 146 Fundal Height: 33 cm Movement: Present     General:  Alert, oriented and cooperative. Patient is in no acute distress.  Skin: Skin is warm and dry. No rash noted.   Cardiovascular: Normal heart rate noted  Respiratory: Normal respiratory effort, no problems with respiration noted  Abdomen: Soft, gravid, appropriate for gestational age.  Pain/Pressure: Present     Pelvic: Cervical exam deferred        Extremities: Normal range of motion.  Edema: None  Mental Status: Normal mood and affect. Normal behavior. Normal judgment and thought content.   Assessment and Plan:  Pregnancy: G4P0030 at [redacted]w[redacted]d 1. Supervision of other normal pregnancy, antepartum   2. Low-lying placenta - Patient for repeat ultrasound on 09/29/2020. - Pelvic rest encouraged. - Bleeding precautions given.    3. Rh negative state in antepartum period - Anticipate rhogam after delivery.  4. Fetal polydactyly affecting  antepartum care of mother, single or unspecified fetus - Patient for repeat ultrasound 09/29/2020.  Preterm labor symptoms and general obstetric precautions including but not limited to vaginal bleeding, contractions, leaking of fluid and fetal movement were reviewed in detail with the patient. Please refer to After Visit Summary for other counseling recommendations.   Return in about 2 weeks (around 09/29/2020) for ROB.  Future Appointments  Date Time Provider Department Center  09/15/2020  4:15 PM Johnny Bridge, MD [facility] None  09/29/2020  2:00 PM [facility] NURSE [facility]  09/29/2020  2:15 PM [facility] US2 [facility]    Johnny Bridge, MD

## 2020-09-15 NOTE — Progress Notes (Signed)
ROB  CC: Pressure.

## 2020-09-29 ENCOUNTER — Ambulatory Visit: Payer: Medicaid Other | Attending: Obstetrics and Gynecology

## 2020-09-29 ENCOUNTER — Other Ambulatory Visit: Payer: Self-pay

## 2020-09-29 ENCOUNTER — Encounter: Payer: Self-pay | Admitting: *Deleted

## 2020-09-29 ENCOUNTER — Ambulatory Visit: Payer: Medicaid Other | Admitting: *Deleted

## 2020-09-29 DIAGNOSIS — Z362 Encounter for other antenatal screening follow-up: Secondary | ICD-10-CM

## 2020-09-29 DIAGNOSIS — O360130 Maternal care for anti-D [Rh] antibodies, third trimester, not applicable or unspecified: Secondary | ICD-10-CM | POA: Diagnosis not present

## 2020-09-29 DIAGNOSIS — O4440 Low lying placenta NOS or without hemorrhage, unspecified trimester: Secondary | ICD-10-CM | POA: Insufficient documentation

## 2020-09-29 DIAGNOSIS — O4403 Placenta previa specified as without hemorrhage, third trimester: Secondary | ICD-10-CM

## 2020-09-29 DIAGNOSIS — Z3A34 34 weeks gestation of pregnancy: Secondary | ICD-10-CM

## 2020-09-29 DIAGNOSIS — O358XX0 Maternal care for other (suspected) fetal abnormality and damage, not applicable or unspecified: Secondary | ICD-10-CM

## 2020-09-30 ENCOUNTER — Telehealth: Payer: Self-pay | Admitting: Obstetrics and Gynecology

## 2020-09-30 DIAGNOSIS — IMO0001 Reserved for inherently not codable concepts without codable children: Secondary | ICD-10-CM

## 2020-09-30 DIAGNOSIS — Z3480 Encounter for supervision of other normal pregnancy, unspecified trimester: Secondary | ICD-10-CM

## 2020-09-30 DIAGNOSIS — O358XX0 Maternal care for other (suspected) fetal abnormality and damage, not applicable or unspecified: Secondary | ICD-10-CM

## 2020-09-30 DIAGNOSIS — O4440 Low lying placenta NOS or without hemorrhage, unspecified trimester: Secondary | ICD-10-CM

## 2020-09-30 NOTE — Progress Notes (Signed)
Patient did not respond to text invite to join My Chart visit. CMA notified patient that provider was with another patient and would be running a little bit behind schedule. Patient was fine with that. When CMA call patient back to ask her to respond to the text invite to join My Chart visit in progress, the patient informed CMA that she is at work right now and having no concerns at this time. No VS reported upon initial call today. Patient asked to reschedule her appointment with [facility].   Raelyn Mora, CNM  09/30/2020 3:31 PM

## 2020-09-30 NOTE — Progress Notes (Signed)
ROB 34w Virtual Visit   CC: None   *Pt not able to check B/P at this.

## 2020-10-08 ENCOUNTER — Encounter: Payer: Self-pay | Admitting: Certified Nurse Midwife

## 2020-10-08 ENCOUNTER — Other Ambulatory Visit: Payer: Self-pay

## 2020-10-08 ENCOUNTER — Ambulatory Visit (INDEPENDENT_AMBULATORY_CARE_PROVIDER_SITE_OTHER): Payer: BC Managed Care – PPO | Admitting: Certified Nurse Midwife

## 2020-10-08 ENCOUNTER — Other Ambulatory Visit (HOSPITAL_COMMUNITY)
Admission: RE | Admit: 2020-10-08 | Discharge: 2020-10-08 | Disposition: A | Discharge status: Home / Self Care | Payer: Medicaid Other | Source: Ambulatory Visit | Attending: Certified Nurse Midwife | Admitting: Certified Nurse Midwife

## 2020-10-08 VITALS — BP 131/74 | HR 81 | Wt 221.0 lb

## 2020-10-08 DIAGNOSIS — O2343 Unspecified infection of urinary tract in pregnancy, third trimester: Secondary | ICD-10-CM | POA: Diagnosis not present

## 2020-10-08 DIAGNOSIS — Z3A36 36 weeks gestation of pregnancy: Secondary | ICD-10-CM | POA: Diagnosis present

## 2020-10-08 DIAGNOSIS — B951 Streptococcus, group B, as the cause of diseases classified elsewhere: Secondary | ICD-10-CM

## 2020-10-08 DIAGNOSIS — O4440 Low lying placenta NOS or without hemorrhage, unspecified trimester: Secondary | ICD-10-CM

## 2020-10-08 DIAGNOSIS — O2340 Unspecified infection of urinary tract in pregnancy, unspecified trimester: Secondary | ICD-10-CM

## 2020-10-08 DIAGNOSIS — O98813 Other maternal infectious and parasitic diseases complicating pregnancy, third trimester: Secondary | ICD-10-CM | POA: Insufficient documentation

## 2020-10-08 DIAGNOSIS — O26899 Other specified pregnancy related conditions, unspecified trimester: Secondary | ICD-10-CM

## 2020-10-08 DIAGNOSIS — IMO0001 Reserved for inherently not codable concepts without codable children: Secondary | ICD-10-CM

## 2020-10-08 DIAGNOSIS — Z3480 Encounter for supervision of other normal pregnancy, unspecified trimester: Secondary | ICD-10-CM

## 2020-10-08 DIAGNOSIS — O358XX0 Maternal care for other (suspected) fetal abnormality and damage, not applicable or unspecified: Secondary | ICD-10-CM

## 2020-10-08 DIAGNOSIS — Z6791 Unspecified blood type, Rh negative: Secondary | ICD-10-CM

## 2020-10-08 NOTE — Patient Instructions (Signed)

## 2020-10-08 NOTE — Progress Notes (Signed)
   PRENATAL VISIT NOTE  Subjective:  Autumn Morgan is a 25 y.o. G4P0030 at [redacted]w[redacted]d being seen today for ongoing prenatal care.  She is currently monitored for the following issues for this low-risk pregnancy and has ABSCESS, PULMONARY; Supervision of normal pregnancy, antepartum; GBS (group B streptococcus) UTI complicating pregnancy; Asthma; Chronic seasonal allergic rhinitis; Polydactyly, fetal, affecting care of mother, antepartum; Low-lying placenta; and Rh negative state in antepartum period on their problem list.  Patient reports no bleeding, no contractions, no cramping, no leaking and swollen ankles and mucus with blood tinge x2..  Contractions: Irritability. Vag. Bleeding: None.  Movement: Present. Denies leaking of fluid.   The following portions of the patient's history were reviewed and updated as appropriate: allergies, current medications, past family history, past medical history, past social history, past surgical history and problem list.   Objective:   Vitals:   10/08/20 1556  BP: 131/74  Pulse: 81  Weight: 100.2 kg    Fetal Status: Fetal Heart Rate (bpm): 149 Fundal Height: 36 cm Movement: Present     General:  Alert, oriented and cooperative. Patient is in no acute distress.  Skin: Skin is warm and dry. No rash noted.   Cardiovascular: Normal heart rate noted  Respiratory: Normal respiratory effort, no problems with respiration noted  Abdomen: Soft, gravid, appropriate for gestational age.  Pain/Pressure: Present     Pelvic: Cervical exam deferred        Extremities: Normal range of motion.  Edema: Moderate pitting, indentation subsides rapidly  Mental Status: Normal mood and affect. Normal behavior. Normal judgment and thought content.   Assessment and Plan:  Pregnancy: G4P0030 at [redacted]w[redacted]d 1. Supervision of other normal pregnancy, antepartum - Anticipatory guidance regarding next visit given - Next visit in 1 week virtually via MyChart  2. Fetal polydactyly affecting  antepartum care of mother, single or unspecified fetus - Known polydactyly but not visualized on [place] on 09/29/20  3. Low-lying placenta - Resolved per [place] on 09/29/20  4. Rh negative state in antepartum period - Rhogam given previously in this pregnancy - Anticipate Rhogam after delivery  5. [redacted] weeks gestation of pregnancy - Cervicovaginal ancillary only( [place])  6. Group B Streptococcus urinary tract infection affecting pregnancy, antepartum - Patient counseled on GBS + diagnosis and treatment during labor  Preterm labor symptoms and general obstetric precautions including but not limited to vaginal bleeding, contractions, leaking of fluid and fetal movement were reviewed in detail with the patient. Please refer to After Visit Summary for other counseling recommendations.   Return in about 2 weeks (around 10/22/2020) for LROB, virtual.  Future Appointments  Date Time Provider Department Center  10/22/2020  2:00 PM Rasch, Harolyn Rutherford, NP [facility] None    Sande Rives, Student-Mid[facility]ife

## 2020-10-08 NOTE — Progress Notes (Signed)
ROB/GBS, c/o swollen ankles, mucus buildup in her throat when she expelled it there was blood in it.

## 2020-10-09 LAB — CERVICOVAGINAL ANCILLARY ONLY
Chlamydia: NEGATIVE
Comment: NEGATIVE
Comment: NORMAL
Neisseria Gonorrhea: NEGATIVE

## 2020-10-22 ENCOUNTER — Encounter: Payer: Self-pay | Admitting: Obstetrics and Gynecology

## 2020-10-22 ENCOUNTER — Telehealth (INDEPENDENT_AMBULATORY_CARE_PROVIDER_SITE_OTHER): Payer: Medicaid Other | Admitting: Obstetrics and Gynecology

## 2020-10-22 DIAGNOSIS — O4440 Low lying placenta NOS or without hemorrhage, unspecified trimester: Secondary | ICD-10-CM

## 2020-10-22 DIAGNOSIS — IMO0001 Reserved for inherently not codable concepts without codable children: Secondary | ICD-10-CM

## 2020-10-22 DIAGNOSIS — O358XX0 Maternal care for other (suspected) fetal abnormality and damage, not applicable or unspecified: Secondary | ICD-10-CM

## 2020-10-22 DIAGNOSIS — Z3A38 38 weeks gestation of pregnancy: Secondary | ICD-10-CM

## 2020-10-22 NOTE — Progress Notes (Signed)
    TELEHEALTH OBSTETRICS VISIT ENCOUNTER NOTE  Provider location: Center for [facility] [place]   I connected with Katharine Look on 10/22/20 at  2:00 PM EST by telephone at home and verified that I am speaking with the correct person using two identifiers. Of note, unable to do video encounter due to technical difficulties.    I discussed the limitations, risks, security and privacy concerns of performing an evaluation and management service by telephone and the availability of in person appointments. I also discussed with the patient that there may be a patient responsible charge related to this service. The patient expressed understanding and agreed to proceed.  Patient is at work, provider is in the office at [facility].  Subjective:  Autumn Morgan is a 25 y.o. G4P0030 at [redacted]w[redacted]d being followed for ongoing prenatal care.  She is currently monitored for the following issues for this low-risk pregnancy and has ABSCESS, PULMONARY; Supervision of normal pregnancy, antepartum; GBS (group B streptococcus) UTI complicating pregnancy; Asthma; Chronic seasonal allergic rhinitis; Polydactyly, fetal, affecting care of mother, antepartum; Low-lying placenta; and Rh negative state in antepartum period on their problem list.  Patient reports no complaints. Reports fetal movement. Denies any contractions, bleeding or leaking of fluid.   The following portions of the patient's history were reviewed and updated as appropriate: allergies, current medications, past family history, past medical history, past social history, past surgical history and problem list.   Objective:  Last menstrual period 01/30/2020. General:  Alert, oriented and cooperative.   Mental Status: Normal mood and affect perceived. Normal judgment and thought content.  Rest of physical exam deferred due to type of encounter  Assessment and Plan:  Pregnancy: G4P0030 at [redacted]w[redacted]d 1. Low-lying placenta  Resolved  Bp's have been normal at  home, she is at work and will check her BP when she gets home In person visit next week.   2. Fetal polydactyly affecting antepartum care of mother, single or unspecified fetus   Term labor symptoms and general obstetric precautions including but not limited to vaginal bleeding, contractions, leaking of fluid and fetal movement were reviewed in detail with the patient.  I discussed the assessment and treatment plan with the patient. The patient was provided an opportunity to ask questions and all were answered. The patient agreed with the plan and demonstrated an understanding of the instructions. The patient was advised to call back or seek an in-person office evaluation/go to MAU at [facility] for any urgent or concerning symptoms. Please refer to After Visit Summary for other counseling recommendations.   I provided 11 minutes of non-face-to-face time during this encounter.  Return in about 1 week (around 10/29/2020), or In person visit, app schedule ok.  No future appointments.  Venia Carbon, NP Center for [employer], [facility]

## 2020-10-22 NOTE — Progress Notes (Signed)
Virtual ROB   CC: None  

## 2020-10-22 NOTE — Patient Instructions (Signed)

## 2020-10-29 ENCOUNTER — Other Ambulatory Visit: Payer: Self-pay

## 2020-10-29 ENCOUNTER — Ambulatory Visit (INDEPENDENT_AMBULATORY_CARE_PROVIDER_SITE_OTHER): Payer: Medicaid Other | Admitting: Obstetrics & Gynecology

## 2020-10-29 VITALS — BP 124/72 | HR 84 | Wt 224.2 lb

## 2020-10-29 DIAGNOSIS — Z6791 Unspecified blood type, Rh negative: Secondary | ICD-10-CM

## 2020-10-29 DIAGNOSIS — O358XX0 Maternal care for other (suspected) fetal abnormality and damage, not applicable or unspecified: Secondary | ICD-10-CM

## 2020-10-29 DIAGNOSIS — O2340 Unspecified infection of urinary tract in pregnancy, unspecified trimester: Secondary | ICD-10-CM

## 2020-10-29 DIAGNOSIS — Z8709 Personal history of other diseases of the respiratory system: Secondary | ICD-10-CM

## 2020-10-29 DIAGNOSIS — IMO0001 Reserved for inherently not codable concepts without codable children: Secondary | ICD-10-CM

## 2020-10-29 DIAGNOSIS — O26899 Other specified pregnancy related conditions, unspecified trimester: Secondary | ICD-10-CM

## 2020-10-29 DIAGNOSIS — B951 Streptococcus, group B, as the cause of diseases classified elsewhere: Secondary | ICD-10-CM

## 2020-10-29 DIAGNOSIS — Z3A39 39 weeks gestation of pregnancy: Secondary | ICD-10-CM

## 2020-10-29 DIAGNOSIS — O480 Post-term pregnancy: Secondary | ICD-10-CM

## 2020-10-29 DIAGNOSIS — Z3480 Encounter for supervision of other normal pregnancy, unspecified trimester: Secondary | ICD-10-CM

## 2020-10-29 NOTE — Progress Notes (Signed)
   PRENATAL VISIT NOTE  Subjective:  Autumn Morgan is a 25 y.o. G4P0030 at [redacted]w[redacted]d being seen today for ongoing prenatal care.  She is currently monitored for the following issues for this low-risk pregnancy and has History of lung abscess; Supervision of normal pregnancy, antepartum; GBS (group B streptococcus) UTI complicating pregnancy; Asthma; Chronic seasonal allergic rhinitis; Polydactyly, fetal, affecting care of mother, antepartum; Low-lying placenta; and Rh negative state in antepartum period on their problem list.  Patient reports no complaints.  Contractions: Irritability. Vag. Bleeding: None.  Movement: Present. Denies leaking of fluid.   The following portions of the patient's history were reviewed and updated as appropriate: allergies, current medications, past family history, past medical history, past social history, past surgical history and problem list.   Objective:   Vitals:   10/29/20 1613  BP: 124/72  Pulse: 84  Weight: 224 lb 3.2 oz (101.7 kg)    Fetal Status: Fetal Heart Rate (bpm): 140 Fundal Height: 38 cm Movement: Present  Presentation: Vertex  General:  Alert, oriented and cooperative. Patient is in no acute distress.  Skin: Skin is warm and dry. No rash noted.   Cardiovascular: Normal heart rate noted  Respiratory: Normal respiratory effort, no problems with respiration noted  Abdomen: Soft, gravid, appropriate for gestational age.  Pain/Pressure: Present     Pelvic: Cervical exam performed in the presence of a chaperone Dilation: 1 Effacement (%): 20 Station: -3  Extremities: Normal range of motion.  Edema: None  Mental Status: Normal mood and affect. Normal behavior. Normal judgment and thought content.   Assessment and Plan:  Pregnancy: G4P0030 at [redacted]w[redacted]d 1. [redacted] weeks gestation of pregnancy - on PNV and iron - Plan induction at 41 weeks if not delivered and will plan BPP after 40 weeks.  Order placed for this today. - [place] MFM FETAL BPP W/NONSTRESS; Future   2. Group B Streptococcus urinary tract infection affecting pregnancy, antepartum - pt is PCN allergic.  Urine culture also showed clindamycin resistance.  Culture sent with reflex sensitivities to see if antibiotics other than vancomycin can be used   3. Supervision of other normal pregnancy, antepartum  4. Fetal polydactyly affecting antepartum care of mother, single or unspecified fetus  5. Rh negative state in antepartum period - received Rhogram 08/18/2020   Term labor symptoms and general obstetric precautions including but not limited to vaginal bleeding, contractions, leaking of fluid and fetal movement were reviewed in detail with the patient. Please refer to After Visit Summary for other counseling recommendations.   Return today (on 10/29/2020) for Office ob visit (MD or APP).  Future Appointments  Date Time Provider Department Center  11/05/2020  3:40 PM Rasch, Harolyn Rutherford, NP [facility] None    Jerene Bears, MD

## 2020-11-03 LAB — STREP GP B CULTURE+RFLX: Strep Gp B Culture+Rflx: NEGATIVE

## 2020-11-05 ENCOUNTER — Other Ambulatory Visit: Payer: Self-pay

## 2020-11-05 ENCOUNTER — Other Ambulatory Visit (HOSPITAL_COMMUNITY): Payer: Self-pay | Admitting: Advanced Practice Midwife

## 2020-11-05 ENCOUNTER — Ambulatory Visit (INDEPENDENT_AMBULATORY_CARE_PROVIDER_SITE_OTHER): Payer: Medicaid Other | Admitting: Obstetrics and Gynecology

## 2020-11-05 VITALS — BP 128/75 | HR 85 | Wt 225.0 lb

## 2020-11-05 DIAGNOSIS — Z3480 Encounter for supervision of other normal pregnancy, unspecified trimester: Secondary | ICD-10-CM

## 2020-11-05 NOTE — Patient Instructions (Signed)
The [employer]  This circuit takes at least 90 minutes to complete so clear your schedule and make mental preparations so you can relax in your environment. The second step requires a lot of pillows so gather them up before beginning Before starting, you should empty your bladder! Have a nice drink nearby, and make sure it has a straw! If you are having contractions, this circuit should be done through contractions, try not to change positions between steps Before you begin...  "I named this 'circuit' after my friend Autumn Morgan, who shared and discussed it with me when I was working with a client whose labor seemed to be stalled out and no longer progressing... This circuit is useful to help get the baby lined up, ideally, in the "Left Occiput Anterior" (LOA) Position, both before labor begins and when some corrections need to be done during labor. Prenatally, this position set can help to rotate a baby. As a natural method of induction, this can help get things going if baby just needed a gentle nudge of position to set things off. To the best of my knowledge, this group of positions will not "hurt" a baby that is already lined up correctly." - Autumn Morgan   Step One: Open-knee Chest Stay in this position for 30 minutes, start in cat/cow, then drop your chest as low as you can to the bed or the floor and your bottom as high as you can. Knees should be fairly wide apart, and the angle between the torso/thighs should be wider than 90 degrees. Wiggle around, prop with lots of pillows and use this time to get totally relaxed. This position allows the baby to scoot out of the pelvis a bit and gives them room to rotate, shift their head position, etc. If the pregnant person finds it helpful, careful positioning with a rebozo under the belly, with gentle tension from a support person behind can help maintain this position for the full 30 minutes.  Step Two:Exaggerated Left Side  Lying Roll to your left side, bringing your top leg as high as possible and keeping your bottom leg straight. Roll forward as much as possible, again using a lot of pillows. Sink into the bed and relax some more. If you fall asleep, that's totally okay and you can stay there! If not, stay here for at least another half an hour. Try and get your top right leg up towards your head and get as rolled over onto your belly as much as possible. If you repeat the circuit during labor, try alternating left and right sides. We know the photo the left is actually right side... just flip the image in your head.  Step Three: Moving and Lunges Lunge, walk stairs facing sideways, 2 at a time, (have a spotter downstairs of you!), take a walk outside with one foot on the curb and the other on the street, sit on a birth ball and hula- anything that's upright and putting your pelvis in open, asymmetrical positions. Spend at least 30 minutes doing this one as well to give your baby a chance to move down. If you are lunging or stair or curb walking, you should lunge/walk/go up stairs in the direction that feels better to you. The key with the lunge is that the toes of the higher leg and mom's belly button should be at right angles. Do not lunge over your knee, that closes the pelvis.     Autumn Morgan: Circuit Creator - [email or web address] Autumn   Morgan, CD, BDT (DONA), LCCE, FACCE: Supporting Content - [email or web address] Emily Weaver Thuman: Photography - [email or web address] Kate Dewey CD/CDT (BAI): Print and Webmaster - [email or web address] [employer] [email or web address]  

## 2020-11-05 NOTE — Progress Notes (Signed)
Patient presents for ROB. Patient desires cervix check. Patient has no concerns.

## 2020-11-05 NOTE — Progress Notes (Signed)
   PRENATAL VISIT NOTE  Subjective:  Autumn Morgan is a 25 y.o. G4P0030 at [redacted]w[redacted]d being seen today for ongoing prenatal care.  She is currently monitored for the following issues for this low-risk pregnancy and has History of lung abscess; Supervision of normal pregnancy, antepartum; GBS (group B streptococcus) UTI complicating pregnancy; Asthma; Chronic seasonal allergic rhinitis; Polydactyly, fetal, affecting care of mother, antepartum; Low-lying placenta; and Rh negative state in antepartum period on their problem list.  Patient reports no complaints.  Contractions: Irritability. Vag. Bleeding: None.  Movement: Present. Denies leaking of fluid.   The following portions of the patient's history were reviewed and updated as appropriate: allergies, current medications, past family history, past medical history, past social history, past surgical history and problem list.   Objective:   Vitals:   11/05/20 1541  BP: 128/75  Pulse: 85  Weight: 225 lb (102.1 kg)    Fetal Status:   Fundal Height: 40 cm Movement: Present  Presentation: Vertex  General:  Alert, oriented and cooperative. Patient is in no acute distress.  Skin: Skin is warm and dry. No rash noted.   Cardiovascular: Normal heart rate noted  Respiratory: Normal respiratory effort, no problems with respiration noted  Abdomen: Soft, gravid, appropriate for gestational age.  Pain/Pressure: Present     Pelvic: Cervical exam performed in the presence of a chaperone Dilation: Fingertip Effacement (%): 20 Station: Ballotable  Extremities: Normal range of motion.  Edema: None  Mental Status: Normal mood and affect. Normal behavior. Normal judgment and thought content.   Assessment and Plan:  Pregnancy: G4P0030 at [redacted]w[redacted]d   1. Supervision of other normal pregnancy, antepartum  Induction orders placed. Will cancel BPP for tomorrow.    Term labor symptoms and general obstetric precautions including but not limited to vaginal bleeding,  contractions, leaking of fluid and fetal movement were reviewed in detail with the patient. Please refer to After Visit Summary for other counseling recommendations.   No follow-ups on file.  Future Appointments  Date Time Provider Department Center  11/06/2020  6:50 AM [facility] SCHED ROOM [facility] None  11/06/2020  1:45 PM [facility] US6 [facility]    Venia Carbon, NP

## 2020-11-06 ENCOUNTER — Ambulatory Visit: Payer: Medicaid Other

## 2020-11-06 ENCOUNTER — Other Ambulatory Visit: Payer: Self-pay

## 2020-11-06 ENCOUNTER — Encounter (HOSPITAL_COMMUNITY): Payer: Self-pay | Admitting: Obstetrics and Gynecology

## 2020-11-06 ENCOUNTER — Encounter (HOSPITAL_COMMUNITY)
Admission: AD | Disposition: A | Discharge status: Home / Self Care | Payer: Self-pay | Source: Home / Self Care | Attending: Obstetrics and Gynecology

## 2020-11-06 ENCOUNTER — Inpatient Hospital Stay (HOSPITAL_COMMUNITY): Payer: Medicaid Other

## 2020-11-06 ENCOUNTER — Inpatient Hospital Stay (HOSPITAL_COMMUNITY): Payer: Medicaid Other | Admitting: Anesthesiology

## 2020-11-06 ENCOUNTER — Inpatient Hospital Stay (HOSPITAL_COMMUNITY)
Admission: AD | Admit: 2020-11-06 | Discharge: 2020-11-08 | DRG: 787 | Disposition: A | Discharge status: Home / Self Care | Payer: Medicaid Other | Attending: Obstetrics and Gynecology | Admitting: Obstetrics and Gynecology

## 2020-11-06 DIAGNOSIS — O9081 Anemia of the puerperium: Secondary | ICD-10-CM | POA: Diagnosis not present

## 2020-11-06 DIAGNOSIS — Z20822 Contact with and (suspected) exposure to covid-19: Secondary | ICD-10-CM | POA: Diagnosis present

## 2020-11-06 DIAGNOSIS — Z3A40 40 weeks gestation of pregnancy: Secondary | ICD-10-CM | POA: Diagnosis not present

## 2020-11-06 DIAGNOSIS — Z880 Allergy status to penicillin: Secondary | ICD-10-CM | POA: Diagnosis not present

## 2020-11-06 DIAGNOSIS — O99019 Anemia complicating pregnancy, unspecified trimester: Secondary | ICD-10-CM

## 2020-11-06 DIAGNOSIS — O99824 Streptococcus B carrier state complicating childbirth: Secondary | ICD-10-CM | POA: Diagnosis present

## 2020-11-06 DIAGNOSIS — O26893 Other specified pregnancy related conditions, third trimester: Secondary | ICD-10-CM | POA: Diagnosis present

## 2020-11-06 DIAGNOSIS — Z3490 Encounter for supervision of normal pregnancy, unspecified, unspecified trimester: Secondary | ICD-10-CM

## 2020-11-06 DIAGNOSIS — O99214 Obesity complicating childbirth: Secondary | ICD-10-CM | POA: Diagnosis present

## 2020-11-06 DIAGNOSIS — O358XX0 Maternal care for other (suspected) fetal abnormality and damage, not applicable or unspecified: Secondary | ICD-10-CM | POA: Diagnosis present

## 2020-11-06 DIAGNOSIS — Z6791 Unspecified blood type, Rh negative: Secondary | ICD-10-CM

## 2020-11-06 DIAGNOSIS — O26899 Other specified pregnancy related conditions, unspecified trimester: Secondary | ICD-10-CM

## 2020-11-06 DIAGNOSIS — O480 Post-term pregnancy: Principal | ICD-10-CM | POA: Diagnosis present

## 2020-11-06 DIAGNOSIS — D62 Acute posthemorrhagic anemia: Secondary | ICD-10-CM | POA: Diagnosis not present

## 2020-11-06 DIAGNOSIS — O2340 Unspecified infection of urinary tract in pregnancy, unspecified trimester: Secondary | ICD-10-CM | POA: Diagnosis present

## 2020-11-06 DIAGNOSIS — IMO0002 Reserved for concepts with insufficient information to code with codable children: Secondary | ICD-10-CM | POA: Diagnosis present

## 2020-11-06 DIAGNOSIS — B951 Streptococcus, group B, as the cause of diseases classified elsewhere: Secondary | ICD-10-CM | POA: Diagnosis present

## 2020-11-06 HISTORY — DX: Post-term pregnancy: O48.0

## 2020-11-06 LAB — TYPE AND SCREEN
ABO/RH(D): O NEG
Antibody Screen: POSITIVE

## 2020-11-06 LAB — CBC
HCT: 39.3 % (ref 36.0–46.0)
Hemoglobin: 13.7 g/dL (ref 12.0–15.0)
MCH: 32 pg (ref 26.0–34.0)
MCHC: 34.9 g/dL (ref 30.0–36.0)
MCV: 91.8 fL (ref 80.0–100.0)
Platelets: 187 10*3/uL (ref 150–400)
RBC: 4.28 MIL/uL (ref 3.87–5.11)
RDW: 14.1 % (ref 11.5–15.5)
WBC: 7.5 10*3/uL (ref 4.0–10.5)
nRBC: 0 % (ref 0.0–0.2)

## 2020-11-06 LAB — RESP PANEL BY RT-PCR (FLU A&B, COVID) ARPGX2
Influenza A by PCR: NEGATIVE
Influenza B by PCR: NEGATIVE
SARS Coronavirus 2 by RT PCR: NEGATIVE

## 2020-11-06 LAB — RPR: RPR Ser Ql: NONREACTIVE

## 2020-11-06 SURGERY — Surgical Case
Anesthesia: Epidural

## 2020-11-06 MED ORDER — FENTANYL CITRATE (PF) 100 MCG/2ML IJ SOLN
100.0000 ug | INTRAMUSCULAR | Status: DC | PRN
Start: 2020-11-06 — End: 2020-11-06
  Administered 2020-11-06: 100 ug via INTRAVENOUS
  Filled 2020-11-06: qty 2

## 2020-11-06 MED ORDER — SODIUM CHLORIDE 0.9% FLUSH: 3.0000 mL | INTRAVENOUS | Status: DC | PRN

## 2020-11-06 MED ORDER — LIDOCAINE HCL (PF) 1 % IJ SOLN
30.0000 mL | INTRAMUSCULAR | Status: DC | PRN
Start: 2020-11-06 — End: 2020-11-06

## 2020-11-06 MED ORDER — MORPHINE SULFATE (PF) 10 MG/ML IV SOLN
INTRAVENOUS | Status: DC | PRN
  Administered 2020-11-06: 3 mg via BUCCAL

## 2020-11-06 MED ORDER — OXYCODONE HCL 5 MG PO TABS: 5.0000 mg | ORAL_TABLET | Freq: Once | ORAL | Status: DC | PRN

## 2020-11-06 MED ORDER — COCONUT OIL OIL
1.0000 "application " | TOPICAL_OIL | Status: DC | PRN
  Administered 2020-11-07: 1 via TOPICAL

## 2020-11-06 MED ORDER — CHLOROPROCAINE HCL (PF) 3 % IJ SOLN
INTRAMUSCULAR | Status: DC | PRN
  Administered 2020-11-06: 20 mL

## 2020-11-06 MED ORDER — ONDANSETRON HCL 4 MG/2ML IJ SOLN
INTRAMUSCULAR | Status: DC | PRN
  Administered 2020-11-06: 4 mg via INTRAVENOUS

## 2020-11-06 MED ORDER — LACTATED RINGERS IV SOLN: 500.0000 mL | Freq: Once | INTRAVENOUS | Status: DC

## 2020-11-06 MED ORDER — DIBUCAINE (PERIANAL) 1 % EX OINT: 1.0000 "application " | TOPICAL_OINTMENT | CUTANEOUS | Status: DC | PRN

## 2020-11-06 MED ORDER — FENTANYL CITRATE (PF) 100 MCG/2ML IJ SOLN
25.0000 ug | INTRAMUSCULAR | Status: DC | PRN
Start: 2020-11-06 — End: 2020-11-06

## 2020-11-06 MED ORDER — TETANUS-DIPHTH-ACELL PERTUSSIS 5-2.5-18.5 LF-MCG/0.5 IM SUSY: 0.5000 mL | PREFILLED_SYRINGE | Freq: Once | INTRAMUSCULAR | Status: DC

## 2020-11-06 MED ORDER — KETOROLAC TROMETHAMINE 30 MG/ML IJ SOLN
30.0000 mg | Freq: Four times a day (QID) | INTRAMUSCULAR | Status: AC
  Administered 2020-11-07 (×2): 30 mg via INTRAVENOUS
  Filled 2020-11-06 (×2): qty 1

## 2020-11-06 MED ORDER — SOD CITRATE-CITRIC ACID 500-334 MG/5ML PO SOLN
30.0000 mL | ORAL | Status: DC | PRN
Start: 2020-11-06 — End: 2020-11-06

## 2020-11-06 MED ORDER — AMISULPRIDE (ANTIEMETIC) 5 MG/2ML IV SOLN: 10.0000 mg | Freq: Once | INTRAVENOUS | Status: DC | PRN

## 2020-11-06 MED ORDER — MENTHOL 3 MG MT LOZG: 1.0000 | LOZENGE | OROMUCOSAL | Status: DC | PRN

## 2020-11-06 MED ORDER — OXYTOCIN BOLUS FROM INFUSION: 333.0000 mL | Freq: Once | INTRAVENOUS | Status: DC

## 2020-11-06 MED ORDER — PHENYLEPHRINE HCL (PRESSORS) 10 MG/ML IV SOLN
INTRAVENOUS | Status: DC | PRN
  Administered 2020-11-06: 40 ug via INTRAVENOUS
  Administered 2020-11-06: 120 ug via INTRAVENOUS
  Administered 2020-11-06 (×3): 80 ug via INTRAVENOUS

## 2020-11-06 MED ORDER — ACETAMINOPHEN 325 MG PO TABS: 650.0000 mg | ORAL_TABLET | ORAL | Status: DC | PRN

## 2020-11-06 MED ORDER — KETOROLAC TROMETHAMINE 30 MG/ML IJ SOLN: 30.0000 mg | Freq: Four times a day (QID) | INTRAMUSCULAR | Status: AC | PRN

## 2020-11-06 MED ORDER — SENNOSIDES-DOCUSATE SODIUM 8.6-50 MG PO TABS
2.0000 | ORAL_TABLET | Freq: Every day | ORAL | Status: DC
  Administered 2020-11-07 – 2020-11-08 (×2): 2 via ORAL
  Filled 2020-11-06 (×2): qty 2

## 2020-11-06 MED ORDER — NALBUPHINE HCL 10 MG/ML IJ SOLN
5.0000 mg | INTRAMUSCULAR | Status: DC | PRN
  Administered 2020-11-07: 5 mg via INTRAVENOUS

## 2020-11-06 MED ORDER — EPHEDRINE 5 MG/ML INJ: 10.0000 mg | INTRAVENOUS | Status: DC | PRN

## 2020-11-06 MED ORDER — TERBUTALINE SULFATE 1 MG/ML IJ SOLN: 0.2500 mg | Freq: Once | INTRAMUSCULAR | Status: DC | PRN

## 2020-11-06 MED ORDER — OXYCODONE-ACETAMINOPHEN 5-325 MG PO TABS: 2.0000 | ORAL_TABLET | ORAL | Status: DC | PRN

## 2020-11-06 MED ORDER — SCOPOLAMINE 1 MG/3DAYS TD PT72
MEDICATED_PATCH | TRANSDERMAL | Status: DC | PRN
  Administered 2020-11-06: 1 via TRANSDERMAL

## 2020-11-06 MED ORDER — KETOROLAC TROMETHAMINE 30 MG/ML IJ SOLN
INTRAMUSCULAR | Status: AC
  Filled 2020-11-06: qty 1

## 2020-11-06 MED ORDER — NALOXONE HCL 4 MG/10ML IJ SOLN
1.0000 ug/kg/h | INTRAVENOUS | Status: DC | PRN
  Filled 2020-11-06: qty 5

## 2020-11-06 MED ORDER — PROMETHAZINE HCL 25 MG/ML IJ SOLN: 6.2500 mg | INTRAMUSCULAR | Status: DC | PRN

## 2020-11-06 MED ORDER — MISOPROSTOL 50MCG HALF TABLET
ORAL_TABLET | ORAL | Status: AC
  Administered 2020-11-06: 50 ug
  Filled 2020-11-06: qty 1

## 2020-11-06 MED ORDER — IBUPROFEN 800 MG PO TABS
800.0000 mg | ORAL_TABLET | Freq: Three times a day (TID) | ORAL | Status: DC
  Administered 2020-11-07 – 2020-11-08 (×3): 800 mg via ORAL
  Filled 2020-11-06 (×3): qty 1

## 2020-11-06 MED ORDER — OXYTOCIN-SODIUM CHLORIDE 30-0.9 UT/500ML-% IV SOLN: 2.5000 [IU]/h | INTRAVENOUS | Status: DC

## 2020-11-06 MED ORDER — OXYCODONE-ACETAMINOPHEN 5-325 MG PO TABS
2.0000 | ORAL_TABLET | ORAL | Status: DC | PRN
Start: 2020-11-06 — End: 2020-11-08
  Administered 2020-11-07 – 2020-11-08 (×3): 2 via ORAL
  Filled 2020-11-06 (×3): qty 2

## 2020-11-06 MED ORDER — LACTATED RINGERS IV SOLN: 500.0000 mL | INTRAVENOUS | Status: DC | PRN

## 2020-11-06 MED ORDER — SIMETHICONE 80 MG PO CHEW: 80.0000 mg | CHEWABLE_TABLET | ORAL | Status: DC | PRN

## 2020-11-06 MED ORDER — ZOLPIDEM TARTRATE 5 MG PO TABS: 5.0000 mg | ORAL_TABLET | Freq: Every evening | ORAL | Status: DC | PRN

## 2020-11-06 MED ORDER — OXYCODONE-ACETAMINOPHEN 5-325 MG PO TABS
1.0000 | ORAL_TABLET | ORAL | Status: DC | PRN
  Administered 2020-11-07: 1 via ORAL
  Filled 2020-11-06: qty 1

## 2020-11-06 MED ORDER — WITCH HAZEL-GLYCERIN EX PADS
1.0000 | MEDICATED_PAD | CUTANEOUS | Status: DC | PRN
Start: 2020-11-06 — End: 2020-11-08

## 2020-11-06 MED ORDER — DIPHENHYDRAMINE HCL 50 MG/ML IJ SOLN: 12.5000 mg | INTRAMUSCULAR | Status: DC | PRN

## 2020-11-06 MED ORDER — VANCOMYCIN HCL IN DEXTROSE 1-5 GM/200ML-% IV SOLN: 1000.0000 mg | Freq: Two times a day (BID) | INTRAVENOUS | Status: DC

## 2020-11-06 MED ORDER — LACTATED RINGERS IV SOLN: INTRAVENOUS | Status: DC | PRN

## 2020-11-06 MED ORDER — SIMETHICONE 80 MG PO CHEW
80.0000 mg | CHEWABLE_TABLET | Freq: Three times a day (TID) | ORAL | Status: DC
  Administered 2020-11-07 – 2020-11-08 (×4): 80 mg via ORAL
  Filled 2020-11-06 (×4): qty 1

## 2020-11-06 MED ORDER — OXYTOCIN-SODIUM CHLORIDE 30-0.9 UT/500ML-% IV SOLN
INTRAVENOUS | Status: DC | PRN
  Administered 2020-11-06: 30 [IU] via INTRAVENOUS

## 2020-11-06 MED ORDER — INFLUENZA VAC SPLIT QUAD 0.5 ML IM SUSY: 0.5000 mL | PREFILLED_SYRINGE | INTRAMUSCULAR | Status: DC

## 2020-11-06 MED ORDER — DEXAMETHASONE SODIUM PHOSPHATE 4 MG/ML IJ SOLN
INTRAMUSCULAR | Status: DC | PRN
  Administered 2020-11-06: 4 mg via INTRAVENOUS

## 2020-11-06 MED ORDER — OXYTOCIN-SODIUM CHLORIDE 30-0.9 UT/500ML-% IV SOLN: 2.5000 [IU]/h | INTRAVENOUS | Status: AC

## 2020-11-06 MED ORDER — SCOPOLAMINE 1 MG/3DAYS TD PT72: 1.0000 | MEDICATED_PATCH | Freq: Once | TRANSDERMAL | Status: DC

## 2020-11-06 MED ORDER — MISOPROSTOL 25 MCG QUARTER TABLET
ORAL_TABLET | ORAL | Status: AC
  Filled 2020-11-06: qty 1

## 2020-11-06 MED ORDER — DIPHENHYDRAMINE HCL 25 MG PO CAPS
25.0000 mg | ORAL_CAPSULE | ORAL | Status: DC | PRN
  Filled 2020-11-06: qty 1

## 2020-11-06 MED ORDER — CEFAZOLIN SODIUM-DEXTROSE 2-3 GM-%(50ML) IV SOLR
INTRAVENOUS | Status: DC | PRN
  Administered 2020-11-06: 2 g via INTRAVENOUS

## 2020-11-06 MED ORDER — MISOPROSTOL 50MCG HALF TABLET: 50.0000 ug | ORAL_TABLET | ORAL | Status: DC | PRN

## 2020-11-06 MED ORDER — NALBUPHINE HCL 10 MG/ML IJ SOLN: 5.0000 mg | INTRAMUSCULAR | Status: DC | PRN

## 2020-11-06 MED ORDER — PRENATAL MULTIVITAMIN CH
1.0000 | ORAL_TABLET | Freq: Every day | ORAL | Status: DC
  Administered 2020-11-07: 1 via ORAL
  Filled 2020-11-06: qty 1

## 2020-11-06 MED ORDER — LIDOCAINE HCL (PF) 1 % IJ SOLN
INTRAMUSCULAR | Status: DC | PRN
  Administered 2020-11-06: 10 mL via EPIDURAL

## 2020-11-06 MED ORDER — PHENYLEPHRINE 40 MCG/ML (10ML) SYRINGE FOR IV PUSH (FOR BLOOD PRESSURE SUPPORT): 80.0000 ug | PREFILLED_SYRINGE | INTRAVENOUS | Status: DC | PRN

## 2020-11-06 MED ORDER — ACETAMINOPHEN 10 MG/ML IV SOLN: 1000.0000 mg | Freq: Once | INTRAVENOUS | Status: DC | PRN

## 2020-11-06 MED ORDER — OXYCODONE-ACETAMINOPHEN 5-325 MG PO TABS: 1.0000 | ORAL_TABLET | ORAL | Status: DC | PRN

## 2020-11-06 MED ORDER — MEPERIDINE HCL 25 MG/ML IJ SOLN: 6.2500 mg | INTRAMUSCULAR | Status: DC | PRN

## 2020-11-06 MED ORDER — KETOROLAC TROMETHAMINE 30 MG/ML IJ SOLN
30.0000 mg | Freq: Four times a day (QID) | INTRAMUSCULAR | Status: AC | PRN
Start: 2020-11-06 — End: 2020-11-07
  Administered 2020-11-06: 30 mg via INTRAMUSCULAR

## 2020-11-06 MED ORDER — NALBUPHINE HCL 10 MG/ML IJ SOLN: 5.0000 mg | Freq: Once | INTRAMUSCULAR | Status: DC | PRN

## 2020-11-06 MED ORDER — SODIUM CHLORIDE 0.9 % IV SOLN: INTRAVENOUS | Status: DC | PRN

## 2020-11-06 MED ORDER — ONDANSETRON HCL 4 MG/2ML IJ SOLN: 4.0000 mg | Freq: Four times a day (QID) | INTRAMUSCULAR | Status: DC | PRN

## 2020-11-06 MED ORDER — NALOXONE HCL 0.4 MG/ML IJ SOLN: 0.4000 mg | INTRAMUSCULAR | Status: DC | PRN

## 2020-11-06 MED ORDER — LACTATED RINGERS IV SOLN
INTRAVENOUS | Status: DC
Start: 2020-11-06 — End: 2020-11-06

## 2020-11-06 MED ORDER — FENTANYL CITRATE (PF) 100 MCG/2ML IJ SOLN
INTRAMUSCULAR | Status: AC
  Administered 2020-11-06: 100 ug via INTRAVENOUS
  Filled 2020-11-06: qty 2

## 2020-11-06 MED ORDER — MORPHINE SULFATE (PF) 0.5 MG/ML IJ SOLN
INTRAMUSCULAR | Status: DC | PRN
  Administered 2020-11-06: 1 mg via INTRAVENOUS

## 2020-11-06 MED ORDER — ACETAMINOPHEN 500 MG PO TABS
1000.0000 mg | ORAL_TABLET | Freq: Four times a day (QID) | ORAL | Status: AC
  Administered 2020-11-06 – 2020-11-07 (×3): 1000 mg via ORAL
  Filled 2020-11-06 (×3): qty 2

## 2020-11-06 MED ORDER — SODIUM CHLORIDE 0.9 % IV SOLN
2.0000 g | Freq: Four times a day (QID) | INTRAVENOUS | Status: DC
  Administered 2020-11-06: 2 g via INTRAVENOUS
  Filled 2020-11-06: qty 2000

## 2020-11-06 MED ORDER — KETOROLAC TROMETHAMINE 30 MG/ML IJ SOLN
INTRAMUSCULAR | Status: DC | PRN
  Administered 2020-11-06: 30 mg via INTRAVENOUS

## 2020-11-06 MED ORDER — ENOXAPARIN SODIUM 60 MG/0.6ML ~~LOC~~ SOLN
50.0000 mg | SUBCUTANEOUS | Status: DC
  Administered 2020-11-07 – 2020-11-08 (×2): 50 mg via SUBCUTANEOUS
  Filled 2020-11-06 (×2): qty 0.6

## 2020-11-06 MED ORDER — OXYCODONE HCL 5 MG/5ML PO SOLN: 5.0000 mg | Freq: Once | ORAL | Status: DC | PRN

## 2020-11-06 MED ORDER — HYDROMORPHONE HCL 1 MG/ML IJ SOLN: 1.0000 mg | INTRAMUSCULAR | Status: DC | PRN

## 2020-11-06 MED ORDER — NALBUPHINE HCL 10 MG/ML IJ SOLN
5.0000 mg | Freq: Once | INTRAMUSCULAR | Status: DC | PRN
  Filled 2020-11-06: qty 1

## 2020-11-06 MED ORDER — ONDANSETRON HCL 4 MG/2ML IJ SOLN: 4.0000 mg | Freq: Three times a day (TID) | INTRAMUSCULAR | Status: DC | PRN

## 2020-11-06 MED ORDER — FENTANYL-BUPIVACAINE-NACL 0.5-0.125-0.9 MG/250ML-% EP SOLN
12.0000 mL/h | EPIDURAL | Status: DC | PRN
Start: 2020-11-06 — End: 2020-11-06
  Administered 2020-11-06: 12 mL/h via EPIDURAL
  Filled 2020-11-06: qty 250

## 2020-11-06 MED ORDER — DIPHENHYDRAMINE HCL 25 MG PO CAPS
25.0000 mg | ORAL_CAPSULE | Freq: Four times a day (QID) | ORAL | Status: DC | PRN
  Administered 2020-11-06: 25 mg via ORAL

## 2020-11-06 SURGICAL SUPPLY — 39 items
BENZOIN TINCTURE PRP APPL 2/3 (GAUZE/BANDAGES/DRESSINGS) ×2 IMPLANT
CHLORAPREP W/TINT 26ML (MISCELLANEOUS) ×2 IMPLANT
CLAMP CORD UMBIL (MISCELLANEOUS) IMPLANT
CLOSURE STERI-STRIP 1/4X4 (GAUZE/BANDAGES/DRESSINGS) ×2 IMPLANT
CLOTH BEACON ORANGE TIMEOUT ST (SAFETY) ×2 IMPLANT
DRAPE C SECTION CLR SCREEN (DRAPES) IMPLANT
DRSG OPSITE POSTOP 4X10 (GAUZE/BANDAGES/DRESSINGS) ×2 IMPLANT
ELECT REM PT RETURN 9FT ADLT (ELECTROSURGICAL) ×2
ELECTRODE REM PT RTRN 9FT ADLT (ELECTROSURGICAL) ×1 IMPLANT
EXTRACTOR VACUUM M CUP 4 TUBE (SUCTIONS) IMPLANT
GLOVE BIO SURGEON STRL SZ7.5 (GLOVE) ×2 IMPLANT
GLOVE BIOGEL PI IND STRL 7.0 (GLOVE) ×1 IMPLANT
GLOVE BIOGEL PI INDICATOR 7.0 (GLOVE) ×1
GOWN STRL REUS W/TWL 2XL LVL3 (GOWN DISPOSABLE) ×2 IMPLANT
GOWN STRL REUS W/TWL LRG LVL3 (GOWN DISPOSABLE) ×4 IMPLANT
KIT ABG SYR 3ML LUER SLIP (SYRINGE) IMPLANT
NEEDLE HYPO 22GX1.5 SAFETY (NEEDLE) ×2 IMPLANT
NEEDLE HYPO 25X5/8 SAFETYGLIDE (NEEDLE) IMPLANT
NS IRRIG 1000ML POUR BTL (IV SOLUTION) ×2 IMPLANT
PACK C SECTION WH (CUSTOM PROCEDURE TRAY) ×2 IMPLANT
PAD OB MATERNITY 4.3X12.25 (PERSONAL CARE ITEMS) ×2 IMPLANT
PENCIL SMOKE EVAC W/HOLSTER (ELECTROSURGICAL) ×2 IMPLANT
RTRCTR C-SECT PINK 25CM LRG (MISCELLANEOUS) ×2 IMPLANT
STRIP CLOSURE SKIN 1/2X4 (GAUZE/BANDAGES/DRESSINGS) ×2 IMPLANT
SUT CHROMIC 1 CTX 36 (SUTURE) ×4 IMPLANT
SUT PLAIN 0 NONE (SUTURE) IMPLANT
SUT VIC AB 1 CT1 36 (SUTURE) ×2 IMPLANT
SUT VIC AB 2-0 CT1 (SUTURE) ×2 IMPLANT
SUT VIC AB 2-0 CT1 27 (SUTURE) ×2
SUT VIC AB 2-0 CT1 TAPERPNT 27 (SUTURE) ×2 IMPLANT
SUT VIC AB 3-0 CT1 27 (SUTURE) ×1
SUT VIC AB 3-0 CT1 TAPERPNT 27 (SUTURE) ×1 IMPLANT
SUT VIC AB 3-0 SH 27 (SUTURE)
SUT VIC AB 3-0 SH 27X BRD (SUTURE) IMPLANT
SUT VIC AB 4-0 KS 27 (SUTURE) ×2 IMPLANT
SYR BULB IRRIGATION 50ML (SYRINGE) IMPLANT
TOWEL OR 17X24 6PK STRL BLUE (TOWEL DISPOSABLE) ×2 IMPLANT
TRAY FOLEY W/BAG SLVR 14FR LF (SET/KITS/TRAYS/PACK) ×2 IMPLANT
WATER STERILE IRR 1000ML POUR (IV SOLUTION) ×2 IMPLANT

## 2020-11-06 NOTE — Op Note (Addendum)
Katharine Look PROCEDURE DATE: 11/06/2020  PREOPERATIVE DIAGNOSES: Intrauterine pregnancy at [redacted]w[redacted]d weeks gestation; prolapsed cord  POSTOPERATIVE DIAGNOSES: The same  PROCEDURE: Primary Low Transverse Cesarean Section, stat  SURGEON:  Dr. Nettie Elm  ASSISTANT:  Geraldine Solar  ANESTHESIOLOGY TEAM: Anesthesiologist: Mellody Dance, MD CRNA: Armanda Heritage, CRNA; Wrinkle, Neldon Newport, CRNA  INDICATIONS: Arnetha Silverthorne is a 25 y.o. G4P0030 at [redacted]w[redacted]d here for IO. She had a foley catheter placed for cervical ripening. While it was removed prolapsed cord was encountered. Upon my arrival to the room, the nurse was able to feel cord and FHT's were noted. Stat c section for prolapsed cord was called. Risks of surgery were quickly reviewed with mother.   FINDINGS:  Viable female infant in cephalic presentation with double nuchal cord.  Apgars and weight as recorded.  Clear amniotic fluid.  Intact placenta, three vessel cord.  Normal uterus, fallopian tubes and ovaries bilaterally. Infant  in room with parents.  ANESTHESIA: Epidural  INTRAVENOUS: As recorded ESTIMATED BLOOD LOSS: 470 ml URINE OUTPUT:  As recorded SPECIMENS: Placenta sent to L&D COMPLICATIONS: None immediate  PROCEDURE IN DETAIL: PROCEDURE IN DETAIL:  The patient was urgently taken to the the operating room her epidural anesthesia was dosed up to surgical level and was found to be adequate. She was then placed in a dorsal supine position with a leftward tilt, prepped quickly with betadine and draped in a sterile manner.  She already had a foley catheter in her bladder from L&D.  After a timeout was performed, a Pfannenstiel skin incision was made with scalpel and carried through to the underlying layer of fascia. The fascial incision was extended bilaterally in a blunt fashion.  The fascia was separated from underlying rectus muscles bluntly.  The rectus muscles were separated in the midline bluntly and the peritoneum was entered  bluntly. Attention was turned to the lower uterine segment where a low transverse hysterotomy was made with a scalpel and extended bilaterally bluntly.  The infant was successfully delivered, the cord was clamped and cut and the infant was handed over to awaiting neonatology team.   The placenta was delivered intact and had a three-vessel cord. The uterus was then cleared of clot and debris.  The hysterotomy was closed with 0 Chromic in a running locked fashion, and an imbricating layer was also placed with 0 Chromic. The pelvis was cleared of all clot and debris. Hemostasis was confirmed on all surfaces. The peritoneum and the rectus muscles were reapproximated with 2/0 Vicryl. The fascia was then closed using 0 Vicryl in a running fashion.  The subcutaneous layer was closed with 3/0 Vicryl.  The skin was closed with a 4-0 Vicryl subcuticular stitch. The patient tolerated the procedure well. Sponge, lap, instrument and needle counts were correct x 2.  She was taken to the recovery room in stable condition.       Nettie Elm, MD, FACOG Obstetrician & Gynecologist, [facility] for [place], [facility]

## 2020-11-06 NOTE — Lactation Note (Signed)
This note was copied from a baby's chart. Lactation Consultation Note  Patient Name: Boy Female Iafrate ZHYQM'V Date: 11/06/2020 Reason for consult: Initial assessment;Term;Primapara;1st time breastfeeding Age:24 hours  Initial visit to 3 hours old infant of a P1 mother. Infant is in basinet upon arrival. Parents and grandmother are present in room. Mother states infant was skin to skin for ~60 minutes after delivery. Mother explains she would like infant to feed. LC unswaddled infant to let him wake up and show hunger cues. Talked to mother about hand expression and demonstrated technique. Colostrum expressed and collected ~34mL in a spoon. Infant started cueing. Demonstrated how to spoonfed infant. Mother is interested in latching.  Set up support pillows for football position to left breast. Assisted with latch. Noted some sucking with flanged lips, a few swallows. Infant does not sustain latch. Several attempts made but infant falls sleep. Mother requests some formula to feed infant at this point. Discussed formula supplementation according to size of a NB stomach. Talked about infant's hunger and fullness cues. Reviewed newborn behavior and expectations during first days of life.  Discussed milk coming to volume and the importance of proper breast stimulation. Mother requests hand pump. LC demonstrated use, unable to see EBM.    Plan: 1-Breastfeeding on demand, ensuring a deep, comfortable latch.  2-Offer breast 8-12 times in 24h period to establish good milk supply. 3-Undressing infant and place skin to skin when ready to breastfeed 4-Keep infant awake during breastfeeding session: massaging breast, infant's hand/shoulder/feet 5-Monitor voids and stools as signs good intake.  6-If needed supplementation with formula, follow guidelines.  7-Encouraged maternal rest, hydration and food intake.  8-Contact LC as needed for feeds/support/concerns/questions   All questions answered at this time.  Provided Lactation services brochure and promoted [employer] booklet information.    Maternal Data Has patient been taught Hand Expression?: Yes Does the patient have breastfeeding experience prior to this delivery?: No  Feeding Mother's Current Feeding Choice: Breast Milk and Formula  LATCH Score Latch: Repeated attempts needed to sustain latch, nipple held in mouth throughout feeding, stimulation needed to elicit sucking reflex.  Audible Swallowing: A few with stimulation  Type of Nipple: Everted at rest and after stimulation (everts after stimulation)  Comfort (Breast/Nipple): Soft / non-tender  Hold (Positioning): Assistance needed to correctly position infant at breast and maintain latch.  LATCH Score: 7  Interventions Interventions: Breast feeding basics reviewed;Assisted with latch;Skin to skin;Breast massage;Hand express;Position options;Support pillows;Expressed milk;Hand pump  Discharge Pump: Personal;Manual WIC Program: Yes  Consult Status Consult Status: Follow-up Date: 11/07/20 Follow-up type: In-patient    Judit Awad A Higuera Ancidey 11/06/2020, 5:30 PM

## 2020-11-06 NOTE — Transfer of Care (Signed)
Immediate Anesthesia Transfer of Care Note  Patient: Autumn Morgan  Procedure(s) Performed: CESAREAN SECTION (N/A )  Patient Location: PACU  Anesthesia Type:Epidural  Level of Consciousness: awake, alert  and oriented  Airway & Oxygen Therapy: Patient Spontanous Breathing  Post-op Assessment: Report given to RN and Post -op Vital signs reviewed and stable  Post vital signs: Reviewed and stable  Last Vitals:  Vitals Value Taken Time  BP 95/66 11/06/20 1415  Temp 36.6 C 11/06/20 1415  Pulse 59 11/06/20 1419  Resp 25 11/06/20 1419  SpO2 98 % 11/06/20 1419  Vitals shown include unvalidated device data.  Last Pain:  Vitals:   11/06/20 1215  TempSrc:   PainSc: 10-Worst pain ever         Complications: No complications documented.

## 2020-11-06 NOTE — Anesthesia Postprocedure Evaluation (Signed)
Anesthesia Post Note  Patient: Autumn Morgan  Procedure(s) Performed: CESAREAN SECTION (N/A )     Patient location during evaluation: Mother Baby Anesthesia Type: Epidural Level of consciousness: awake and alert, oriented and patient cooperative Pain management: pain level controlled Vital Signs Assessment: post-procedure vital signs reviewed and stable Respiratory status: spontaneous breathing Cardiovascular status: stable Postop Assessment: no headache, epidural receding, patient able to bend at knees and no signs of nausea or vomiting Anesthetic complications: no Comments: Pt. Is bending knees 90 degrees.  No c/o pain.   No complications documented.  Last Vitals:  Vitals:   11/06/20 1600 11/06/20 1640  BP: 123/76 118/68  Pulse: (!) 59 62  Resp: 18 16  Temp: 36.8 C 37.1 C  SpO2: 99% 99%    Last Pain:  Vitals:   11/06/20 1640  TempSrc: Oral  PainSc:    Pain Goal:                   [facility]

## 2020-11-06 NOTE — Anesthesia Procedure Notes (Signed)
Epidural Patient location during procedure: OB Start time: 11/06/2020 12:35 PM End time: 11/06/2020 12:45 PM  Staffing Anesthesiologist: Mellody Dance, MD Performed: anesthesiologist   Preanesthetic Checklist Completed: patient identified, IV checked, site marked, risks and benefits discussed, monitors and equipment checked, pre-op evaluation and timeout performed  Epidural Patient position: sitting Prep: DuraPrep Patient monitoring: heart rate, cardiac monitor, continuous pulse ox and blood pressure Approach: midline Location: L2-L3 Injection technique: LOR saline  Needle:  Needle type: Tuohy  Needle gauge: 17 G Needle length: 9 cm Needle insertion depth: 7 cm Catheter type: closed end flexible Catheter size: 20 Guage Catheter at skin depth: 12 cm Test dose: negative and Other  Assessment Events: blood not aspirated, injection not painful, no injection resistance and negative IV test  Additional Notes Informed consent obtained prior to proceeding including risk of failure, 1% risk of PDPH, risk of minor discomfort and bruising.  Discussed rare but serious complications including epidural abscess, permanent nerve injury, epidural hematoma.  Discussed alternatives to epidural analgesia and patient desires to proceed.  Timeout performed pre-procedure verifying patient name, procedure, and platelet count.  Patient tolerated procedure well.

## 2020-11-06 NOTE — Anesthesia Preprocedure Evaluation (Addendum)
Anesthesia Evaluation  Patient identified by MRN, date of birth, ID band Patient awake    Reviewed: Allergy & Precautions, H&P , NPO status , Patient's Chart, lab work & pertinent test results  Airway Mallampati: II  TM Distance: >3 FB Neck ROM: Full    Dental no notable dental hx.    Pulmonary asthma ,    Pulmonary exam normal breath sounds clear to auscultation       Cardiovascular negative cardio ROS Normal cardiovascular exam Rhythm:Regular Rate:Normal     Neuro/Psych negative neurological ROS  negative psych ROS   GI/Hepatic negative GI ROS, Neg liver ROS,   Endo/Other  Morbid obesity  Renal/GU negative Renal ROS  negative genitourinary   Musculoskeletal negative musculoskeletal ROS (+)   Abdominal   Peds negative pediatric ROS (+)  Hematology negative hematology ROS (+)   Anesthesia Other Findings   Reproductive/Obstetrics negative OB ROS                             Anesthesia Physical Anesthesia Plan  ASA: II and emergent  Anesthesia Plan: Epidural   Post-op Pain Management:    Induction:   PONV Risk Score and Plan: Scopolamine patch - Pre-op, Ondansetron and Treatment may vary due to age or medical condition  Airway Management Planned: Natural Airway  Additional Equipment:   Intra-op Plan:   Post-operative Plan:   Informed Consent: I have reviewed the patients History and Physical, chart, labs and discussed the procedure including the risks, benefits and alternatives for the proposed anesthesia with the patient or authorized representative who has indicated his/her understanding and acceptance.       Plan Discussed with: Anesthesiologist  Anesthesia Plan Comments: (Stat section called for cord prolapse. Plan to use 3% chloroprocaine in epidural for surgical anestheisa. GETA as backup plan. Case emergent and no surgical consent obtained prior. Patient previously  consented for epidural placement. )       Anesthesia Quick Evaluation

## 2020-11-06 NOTE — Discharge Summary (Shared)
Physician Discharge Summary  Patient ID: Autumn Morgan MRN: [id number] DOB/AGE: 01/03/1996 25 y.o.  Admit date: 11/06/2020 Discharge date: 11/06/2020  Admission Diagnoses: IUP 40 1/7 weeks  Discharge Diagnoses:  Prolapsed cord, Stat c section Active Problems:   GBS (group B streptococcus) UTI complicating pregnancy   Polydactyly, fetal, affecting care of mother, antepartum   Rh negative state in antepartum period   Post-dates pregnancy   Discharged Condition: good  Hospital Course: Autumn Morgan was admitted for IOL d/t to post dates.  Unfortunately during the course of her induction, prolapsed cord was encountered. She underwent a Stat c section without problems. See OP note for additional information. Pt's post op course was unremarkable. She progressed to ambulating, voiding, tolerating diet, + flatus and good oral pain control  Consults: None  Significant Diagnostic Studies: labs  Treatments: surgery: Stat LTCS d/t prolapsed cord  Discharge Exam: Blood pressure 111/67, pulse 65, temperature 98.3 F (36.8 C), temperature source Oral, resp. rate 17, height 5\' 9"  (1.753 m), weight 103.2 kg, last menstrual period 01/30/2020, SpO2 100 %. {physical 03/31/2020  Disposition:    Allergies as of 11/06/2020      Reactions   Penicillins Rash   Client was able to take Amoxicillin with no problem 04-2020    Med Rec must be completed prior to using this SMARTLINK***        Signed: 06-29-2004 11/06/2020, 2:19 PM

## 2020-11-06 NOTE — H&P (Addendum)
LABOR AND DELIVERY ADMISSION HISTORY AND PHYSICAL NOTE  Autumn Morgan is a 25 y.o. female G18P0030 with IUP at 69w1dby LMP presenting for IOL for post dates. She reports positive fetal movement. She denies leakage of fluid or vaginal bleeding. She denies contractions or cramping at this time.   Prenatal History/Complications: PNC at [facility] Pregnancy complications:  - Polydactyly, fetal  - Rh negative state in pregnancy  - GBS UTI complicating pregnancy   Past Medical History: Past Medical History:  Diagnosis Date  . Asthma     Past Surgical History: History reviewed. No pertinent surgical history.  Obstetrical History: OB History    Gravida  4   Para      Term      Preterm      AB  3   Living        SAB      IAB  3   Ectopic      Multiple      Live Births              Social History: Social History   Socioeconomic History  . Marital status: Single    Spouse name: Not on file  . Number of children: Not on file  . Years of education: Not on file  . Highest education level: Not on file  Occupational History  . Not on file  Tobacco Use  . Smoking status: Never Smoker  . Smokeless tobacco: Never Used  Vaping Use  . Vaping Use: Never used  Substance and Sexual Activity  . Alcohol use: Not Currently  . Drug use: Not Currently  . Sexual activity: Yes    Partners: Male    Comment: Pregnant   Other Topics Concern  . Not on file  Social History Narrative  . Not on file   Social Determinants of Health   Financial Resource Strain: Not on file  Food Insecurity: Not on file  Transportation Needs: Not on file  Physical Activity: Not on file  Stress: Not on file  Social Connections: Not on file    Family History: Family History  Problem Relation Age of Onset  . Hypertension Mother     Allergies: Allergies  Allergen Reactions  . Penicillins     Client was able to take Amoxicillin with no problem 04-2020    Medications Prior to  Admission  Medication Sig Dispense Refill Last Dose  . Blood Pressure Monitor KIT 1 Device by Does not apply route once a week. To be monitored Regularly at home. 1 kit 0   . ferrous sulfate 325 (65 FE) MG tablet Take 1 tablet (325 mg total) by mouth 2 (two) times daily with a meal. 60 tablet 5   . Prenat-Fe Poly-Methfol-FA-DHA (VITAFOL ULTRA) 29-0.6-0.4-200 MG CAPS Take 1 capsule by mouth daily before breakfast. 90 capsule 4      Review of Systems  All systems reviewed and negative except as stated in HPI  Physical Exam Blood pressure 136/81, pulse 99, temperature 98.3 F (36.8 C), temperature source Oral, resp. rate 17, height '5\' 9"'  (1.753 m), weight 103.2 kg, last menstrual period 01/30/2020. General appearance: alert, cooperative and no distress Lungs: clear to auscultation bilaterally Heart: regular rate and rhythm Abdomen: soft, non-tender; bowel sounds normal Extremities: No calf swelling or tenderness Presentation: cephalic Fetal monitoring: 130/moderate/+accels/ no decelerations  Uterine activity: irregular mild contractions  Dilation: 1 Effacement (%): 30 Station: -3 Exam by:: VWende BushyCNM  Prenatal labs: ABO, Rh: --/--/  PENDING (02/11 0840) Antibody: PENDING (02/11 0840) Rubella: 15.10 (08/16 1017) RPR: Non Reactive (11/09 1021)  HBsAg: Negative (08/16 1017)  HIV: Non Reactive (11/09 1021)  GC/Chlamydia: negative GBS: Negative/-- (02/04 0846)  2 hr Glucola: 85-76-73, normal  Genetic screening:  Low risk  Anatomy US: bilateral polydactyl, female   Nursing Staff Provider  Office Location  [place]  Dating  LMP  Language  ENGLISH  Anatomy US  bilateral polydactyly Previa> 1cm from os on 11/10  Flu Vaccine  DECLINED   Genetic Screen  NIPS: Low risk, female    AFP:   Not done   TDaP vaccine  08/04/20 Hgb A1C or  GTT Early  Third trimester   Ref. Range 08/04/2020 10:21  Glucose, 1 hour Latest Ref Range: 65 - 179 mg/dL 85  Glucose, Fasting Latest Ref Range: 65 - 91 mg/dL  76  Glucose, 2 hour Latest Ref Range: 65 - 152 mg/dL 73    Rhogam  08/18/2020   LAB RESULTS   Covid vax 5/1 & 5/22 Blood Type O/Negative/-- (08/16 1017)   Feeding Plan Breast  Antibody Negative (08/16 1017)  Contraception Yes Burnis Medin  Rubella 15.10 (08/16 1017)  Circumcision YES  RPR Non Reactive (11/09 1021)   Pediatrician  Undecided  HBsAg Negative (08/16 1017)   Support Person FOB,MOM Tammy Albright HCVAb Negative  Prenatal Classes No  HIV Non Reactive (11/09 1021)     BTL Consent  GBS  (For PCN allergy, check sensitivities)   VBAC Consent  Pap 05/11/2020 neg    Hgb Electro  Negative [facility]  BP Cuff  05/04/20 CF Negative [facility]    SMA Negative [facility]    Waterbirth  '[ ]'  Class '[ ]'  Consent '[ ]'  CNM visit    Induction  '[ ]'  Orders Entered '[ ]' Foley Y/N    Prenatal Transfer Tool  Maternal Diabetes: No Genetic Screening: Normal Maternal Ultrasounds/Referrals: Other: Polydactyl  Fetal Ultrasounds or other Referrals:  None Maternal Substance Abuse:  No Significant Maternal Medications:  None Significant Maternal Lab Results: Group B Strep positive  Results for orders placed or performed during the hospital encounter of 11/06/20 (from the past 24 hour(s))  CBC   Collection Time: 11/06/20  8:16 AM  Result Value Ref Range   WBC 7.5 4.0 - 10.5 K/uL   RBC 4.28 3.87 - 5.11 MIL/uL   Hemoglobin 13.7 12.0 - 15.0 g/dL   HCT 39.3 36.0 - 46.0 %   MCV 91.8 80.0 - 100.0 fL   MCH 32.0 26.0 - 34.0 pg   MCHC 34.9 30.0 - 36.0 g/dL   RDW 14.1 11.5 - 15.5 %   Platelets 187 150 - 400 K/uL   nRBC 0.0 0.0 - 0.2 %  Type and screen   Collection Time: 11/06/20  8:40 AM  Result Value Ref Range   ABO/RH(D) PENDING    Antibody Screen PENDING    Sample Expiration      11/09/2020,2359 Performed at [facility], [place] [address], [place], [place] [address]     Patient Active Problem List   Diagnosis Date Noted  . Post-dates pregnancy 11/06/2020  . Low-lying placenta 08/18/2020  . Rh negative  state in antepartum period 08/18/2020  . Polydactyly, fetal, affecting care of mother, antepartum 08/08/2020  . GBS (group B streptococcus) UTI complicating pregnancy 32/95/1884  . Supervision of normal pregnancy, antepartum 05/04/2020  . Chronic seasonal allergic rhinitis 11/21/2016  . Asthma 10/10/2016  . 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 G4P0030 at 60w1dhere for IOL for PD   #Labor: IOL with FB and cytotec, SROM at insertion of foley balloon  #Pain: Plans epidural  #FWB: Cat I  #ID:  GBS positive #MOF: Breast  #MOC:Patch  #Circ:  Yes, inpatient   RLajean Manes CNM 11/06/2020, 9:25 AM

## 2020-11-06 NOTE — Progress Notes (Signed)
Late entry due to patient care   CNM to bedside @1313  due to patient recently getting an epidural and cervical evaluation - RN reports that foley balloon recently removed after epidural   Patient reports that she is comfortable with the epidural  Foley catheter placed by RN   FHR 130/moderate/+accels/ early and variable decelerations since epidural  Contractions 2-5 minutes/moderate by palpation.   Cervical examination performed by CNM and cord prolapse noted @1322 . Requested RN to call Dr to bedside and to notify charge nurse.  Dilation: 6 Effacement (%): 80 Cervical Position: Posterior Station: -1,-2 Presentation: Vertex (with cord prolapse) Exam by:: Alysia Penna CNM  Dr [phone] to bedside and called STAT C/S.    Lanice Shirts, CNM 11/06/20, 2:43 PM

## 2020-11-07 LAB — CBC
HCT: 29.7 % — ABNORMAL LOW (ref 36.0–46.0)
Hemoglobin: 10.4 g/dL — ABNORMAL LOW (ref 12.0–15.0)
MCH: 31.9 pg (ref 26.0–34.0)
MCHC: 35 g/dL (ref 30.0–36.0)
MCV: 91.1 fL (ref 80.0–100.0)
Platelets: 162 10*3/uL (ref 150–400)
RBC: 3.26 MIL/uL — ABNORMAL LOW (ref 3.87–5.11)
RDW: 13.8 % (ref 11.5–15.5)
WBC: 16.3 10*3/uL — ABNORMAL HIGH (ref 4.0–10.5)
nRBC: 0 % (ref 0.0–0.2)

## 2020-11-07 LAB — CREATININE, SERUM
Creatinine, Ser: 0.72 mg/dL (ref 0.44–1.00)
GFR, Estimated: >60 mL/min (ref >60)

## 2020-11-07 MED ORDER — RHO D IMMUNE GLOBULIN 1500 UNIT/2ML IJ SOSY
300.0000 ug | PREFILLED_SYRINGE | Freq: Once | INTRAMUSCULAR | Status: AC
  Administered 2020-11-07: 300 ug via INTRAVENOUS
  Filled 2020-11-07: qty 2

## 2020-11-07 MED ORDER — LACTATED RINGERS IV SOLN: Freq: Once | INTRAVENOUS | Status: AC

## 2020-11-07 NOTE — Lactation Note (Signed)
This note was copied from a baby's chart. Lactation Consultation Note  Patient Name: Autumn Morgan NOBSJ'G Date: 11/07/2020   Age:24 hours  LC visited with family at moms bedside.  Infant has gone to get circumcised.  Mom reports he has not been breastfeeding very well until this last feeding and at this last feeding he did pretty well.   Reviewed normal newborn behavior and cluster feeding.  Reviewed post circ behavior./  Urged mom to call lactation as needed.   Maternal Data    Feeding Nipple Type: Slow - flow  LATCH Score                    Lactation Tools Discussed/Used    Interventions    Discharge    Consult Status      [name] 11/07/2020, 12:53 PM

## 2020-11-07 NOTE — Lactation Note (Signed)
This note was copied from a baby's chart. Lactation Consultation Note  Patient Name: Autumn Morgan EPPIR'J Date: 11/07/2020 Reason for consult: Follow-up assessment;Difficult latch;1st time breastfeeding;Primapara;Term Age:24 hours [place] Dec, RN reports setting mom up with DEBP, baby is not consistently latching to the breast, request f/u. Upon entering the room mom sitting on the edge of the bed holding fussy baby, states getting ready to latch baby d/t baby having hunger cues. Mom reports long term plans of exclusively breastfeeding, short term breast and formula as long as baby needs.  LC assisted with placing baby in football hold right breast, mom latched baby shallowly. LC demonstrated sandwiching of breast and latched baby deeply, baby with 2-3 sucks and fell asleep. Mom states baby last ate ~730p (formula). NT shadow demonstrated hand expression. Mom advised to feed baby on cue, wake if >3hrs since last feeding, sandwich breast to achieve proper latch, pump for stimulation after feedings or q3hrs, hand express for colostrum, call if latch support needed prior to next visit. BGilliam, RN, IBCLC  Maternal Data    Feeding Mother's Current Feeding Choice: Breast Milk and Formula  LATCH Score Latch: Repeated attempts needed to sustain latch, nipple held in mouth throughout feeding, stimulation needed to elicit sucking reflex.  Audible Swallowing: None  Type of Nipple: Everted at rest and after stimulation (short)  Comfort (Breast/Nipple): Soft / non-tender  Hold (Positioning): Assistance needed to correctly position infant at breast and maintain latch.  LATCH Score: 6   Lactation Tools Discussed/Used    Interventions Interventions: Breast feeding basics reviewed;Assisted with latch;Breast massage;Hand express;Position options;Adjust position;Expressed milk;DEBP  Discharge    Consult Status Consult Status: Follow-up Date: 11/08/20 Follow-up type: In-patient    Charlynn Court 11/07/2020, 9:29 PM

## 2020-11-07 NOTE — Progress Notes (Signed)
Subjective: Postpartum Day 1: Cesarean Delivery of a [redacted]w[redacted]d. Patient reports no problems voiding, is going to order breakfast.  Reports some burning on the side of incision  Rates abdominal pain with movement a five (5) out of ten (10) on the pain scale.  Still trying to figure out breastfeeding, denies nipple soreness, blisters, or sores.  Denies breathing difficulty, respiratory distress, chest pain, nausea, vomiting, excessive vaginal bleeding, and flatus or BM at this time.    Objective: Vital signs in last 24 hours: Temp:  [97.8 F (36.6 C)-99.2 F (37.3 C)] 99.1 F (37.3 C) (02/12 0430) Pulse Rate:  [57-99] 60 (02/12 0430) Resp:  [15-24] 18 (02/12 0430) BP: (84-141)/(53-130) 105/75 (02/12 0430) SpO2:  [98 %-100 %] 99 % (02/12 0430) Weight:  [103.2 kg] 103.2 kg (02/11 0827)  Physical Exam:  General: alert and cooperative Lochia: appropriate Uterine Fundus: firm Incision: healing well, no significant drainage DVT Evaluation: No evidence of DVT seen on physical exam.  Recent Labs    11/06/20 0816 11/07/20 0529  HGB 13.7 10.4*  HCT 39.3 29.7*    Assessment/Plan: Status post Cesarean section. Doing well postoperatively.   Encouraged patient to ask nurse for pain medication as needed, and let her know her options for pain management.  Encouraged patient to call lactation as needed and have them show her different positions that may be less painful on her incision site.  Questions and concerns addressed, reassurance provided.  Continue current care.  Juliann Pares, Student-MidWife [employer] 11/07/2020, 7:29 AM

## 2020-11-08 LAB — RH IG WORKUP (INCLUDES ABO/RH)
ABO/RH(D): O NEG
Fetal Screen: NEGATIVE
Gestational Age(Wks): 40.1
Unit division: 0

## 2020-11-08 MED ORDER — OXYCODONE HCL 5 MG PO TABS: 5.0000 mg | ORAL_TABLET | ORAL | 0 refills | Status: DC | PRN

## 2020-11-08 MED ORDER — FERROUS SULFATE 325 (65 FE) MG PO TABS: 325.0000 mg | ORAL_TABLET | ORAL | 5 refills | Status: DC

## 2020-11-08 MED ORDER — ACETAMINOPHEN 500 MG PO TABS: 1000.0000 mg | ORAL_TABLET | Freq: Four times a day (QID) | ORAL | 2 refills | Status: AC | PRN

## 2020-11-08 MED ORDER — IBUPROFEN 600 MG PO TABS: 600.0000 mg | ORAL_TABLET | Freq: Four times a day (QID) | ORAL | Status: DC | PRN

## 2020-11-08 MED ORDER — COCONUT OIL OIL: 1.0000 "application " | TOPICAL_OIL | 0 refills | Status: DC | PRN

## 2020-11-08 NOTE — Progress Notes (Signed)
Subjective: POD#2 pLTCS  Patient is doing well without complaints. Ambulating without difficulty. Voiding and passing flatus. Tolerating PO. Abdominal pain improved. Vaginal bleeding decreased.  Objective: Vital signs in last 24 hours: Temp:  [98.1 F (36.7 C)-98.2 F (36.8 C)] 98.1 F (36.7 C) (02/13 0616) Pulse Rate:  [60-72] 72 (02/13 0616) Resp:  [16-18] 16 (02/13 0616) BP: (116-131)/(61-78) 117/78 (02/13 0616) SpO2:  [99 %-100 %] 100 % (02/13 0616)  Physical Exam:  General: alert, cooperative and no distress Lochia: appropriate Uterine Fundus: firm Incision: dressing c/d/i DVT Evaluation: No evidence of DVT seen on physical exam.  Recent Labs    11/06/20 0816 11/07/20 0529  HGB 13.7 10.4*  HCT 39.3 29.7*    Assessment/Plan: POD#2 pLTCS   -doing well without complaints, meeting pp milestones  -VSS  -circ done yesterday  -previously desired patch, counseled, undecided  #Acute blood loss anemia  -hgb 13.7>10.4, asymptomatic  -no intervention  #Rh neg  -received rhogam postpartum  Plan for discharge tomorrow, unless patient desires early discharge.  Alric Seton 11/08/2020, 8:18 AM

## 2020-11-08 NOTE — Discharge Summary (Signed)
Postpartum Discharge Summary  Date of Service updated 11/08/20     Patient Name: Autumn Morgan DOB: 1995/10/11 MRN: [id number]  Date of admission: 11/06/2020 Delivery date:11/06/2020  Delivering provider: Chancy Milroy  Date of discharge: 11/08/2020  Admitting diagnosis: Post-dates pregnancy [O48.0] Intrauterine pregnancy: [redacted]w[redacted]d    Secondary diagnosis:  Active Problems:   Supervision of normal pregnancy, antepartum   GBS (group B streptococcus) UTI complicating pregnancy   Polydactyly, fetal, affecting care of mother, antepartum   Rh negative state in antepartum period   Post-dates pregnancy   Cesarean delivery delivered  Additional problems: none    Discharge diagnosis: Term Pregnancy Delivered and Anemia                                              Post partum procedures:rhogam Augmentation: AROM, Cytotec and IP Foley Complications: None  Hospital course: Induction of Labor With Cesarean Section   25y.o. yo [id number] [id number] admitted to the hospital 11/06/2020 for induction of labor. Patient had a labor course significant for cord prolapse after IOL methods noted above. The patient went for cesarean section due to Cord Prolapse. Delivery details are as follows: Membrane Rupture Time/Date: 9:10 AM ,11/06/2020   Delivery Method:C-Section, Forcep Assisted  Details of operation can be found in separate operative Note.  Patient had an uncomplicated postpartum course. She is ambulating, tolerating a regular diet, passing flatus, and urinating well.  Patient is discharged home in stable condition on 11/08/20.      Newborn Data: Birth date:11/06/2020  Birth time:1:34 PM  Gender:Female  Living status:Living  Apgars:7 ,9  Weight:3235 g                                 Magnesium Sulfate received: No BMZ received: No Rhophylac:Yes MMR:N/A T-DaP:Given prenatally Flu: No Transfusion:No  Physical exam  Vitals:   11/07/20 0906 11/07/20 1609 11/07/20 2212 11/08/20 0616  BP:  118/66 131/61 116/67 117/78  Pulse: 60 67 69 72  Resp: _0 Temp: 98.1 F (36.7 C) 98.2 F (36.8 C) 98.2 F (36.8 C) 98.1 F (36.7 C)  TempSrc: Oral Oral Oral Oral  SpO2: 100% 100% 99% 100%  Weight:      Height:       General: alert, cooperative and no distress Lochia: appropriate Uterine Fundus: firm Incision: Dressing is clean, dry, and intact DVT Evaluation: No evidence of DVT seen on physical exam. Labs: Lab Results  Component Value Date   WBC 16.3 (H) 11/07/2020   HGB 10.4 (L) 11/07/2020   HCT 29.7 (L) 11/07/2020   MCV 91.1 11/07/2020   PLT 162 11/07/2020   CMP Latest Ref Rng & Units 11/07/2020  Glucose 70 - 99 mg/dL -  BUN 6 - 23 mg/dL -  Creatinine 0.44 - 1.00 mg/dL 0.72  Sodium 135 - 145 mEq/L -  Potassium 3.5 - 5.1 mEq/L -  Chloride 96 - 112 mEq/L -  CO2 19 - 32 mEq/L -  Calcium 8.4 - 10.5 mg/dL -  Total Protein 6.0 - 8.3 g/dL -  Total Bilirubin 0.3 - 1.2 mg/dL -  Alkaline Phos 50 - 162 U/L -  AST 0 - 37 U/L -  ALT 0 - 35 U/L -   Edinburgh Score: EdFlavia Shipperostnatal  Depression Scale Screening Tool 11/07/2020  I have been able to laugh and see the funny side of things. 0  I have looked forward with enjoyment to things. 0  I have blamed myself unnecessarily when things went wrong. 0  I have been anxious or worried for no good reason. 2  I have felt scared or panicky for no good reason. 0  Things have been getting on top of me. 0  I have been so unhappy that I have had difficulty sleeping. 0  I have felt sad or miserable. 0  I have been so unhappy that I have been crying. 0  The thought of harming myself has occurred to me. 0  Edinburgh Postnatal Depression Scale Total 2     After visit meds:  Allergies as of 11/08/2020      Reactions   Penicillins Rash   Client was able to take Amoxicillin with no problem 04-2020      Medication List    TAKE these medications   acetaminophen 500 MG tablet Commonly known as: TYLENOL Take 2 tablets (1,000  mg total) by mouth every 6 (six) hours as needed.   albuterol 108 (90 Base) MCG/ACT inhaler Commonly known as: VENTOLIN HFA Inhale 2 puffs into the lungs every 6 (six) hours as needed for wheezing or shortness of breath.   Blood Pressure Monitor Kit 1 Device by Does not apply route once a week. To be monitored Regularly at home.   coconut oil Oil Apply 1 application topically as needed.   ferrous sulfate 325 (65 FE) MG tablet Take 1 tablet (325 mg total) by mouth every other day. What changed: when to take this   ibuprofen 600 MG tablet Commonly known as: ADVIL Take 1 tablet (600 mg total) by mouth every 6 (six) hours as needed.   oxyCODONE 5 MG immediate release tablet Commonly known as: Roxicodone Take 1 tablet (5 mg total) by mouth every 4 (four) hours as needed for severe pain.   Vitafol Ultra 29-0.6-0.4-200 MG Caps Take 1 capsule by mouth daily before breakfast.        Discharge home in stable condition Infant Feeding: Breast Infant Disposition:home with mother Discharge instruction: per After Visit Summary and Postpartum booklet. Activity: Advance as tolerated. Pelvic rest for 6 weeks.  Diet: routine diet Future Appointments:No future appointments. Follow up Visit: Message sent to [facility] 11/08/20 by Sylvester Harder.   Please schedule this patient for a In person postpartum visit in 4 weeks with the following provider: Any provider. Additional Postpartum F/U:Incision check 1 week  Low risk pregnancy complicated by: none Delivery mode:  C-Section, Forcep Assisted  Anticipated Birth Control:  IUD, please place at postpartum appt   12/09/1759 Arrie Senate, MD

## 2020-11-08 NOTE — Discharge Instructions (Signed)
-take tylenol 1000 mg every 6 hours as needed for pain, alternate with ibuprofen 600 mg every 6 hours -take oxycodone as needed if tylenol and ibuprofen aren't working -drink plenty of water to help with breastfeeding -continue prenatal vitamins while you are breastfeeding -think about birth control options-->bedisider.org is a great website! You can get any form of birth control from the health department for free   Cesarean Delivery, Care After This sheet gives you information about how to care for yourself after your procedure. Your health care provider may also give you more specific instructions. If you have problems or questions, contact your health care provider. What can I expect after the procedure? After the procedure, it is common to have:  A small amount of blood or clear fluid coming from the incision.  Some redness, swelling, and pain in your incision area.  Some abdominal pain and soreness.  Vaginal bleeding (lochia). Even though you did not have a vaginal delivery, you will still have vaginal bleeding and discharge.  Pelvic cramps.  Fatigue. You may have pain, swelling, and discomfort in the tissue between your vagina and your anus (perineum) if:  Your C-section was unplanned, and you were allowed to labor and push.  An incision was made in the area (episiotomy) or the tissue tore during attempted vaginal delivery. Follow these instructions at home: Incision care  Follow instructions from your health care provider about how to take care of your incision. Make sure you: ? Wash your hands with soap and water before you change your bandage (dressing). If soap and water are not available, use hand sanitizer. ? If you have a dressing, change it or remove it as told by your health care provider. ? Leave stitches (sutures), skin staples, skin glue, or adhesive strips in place. These skin closures may need to stay in place for 2 weeks or longer. If adhesive strip edges start  to loosen and curl up, you may trim the loose edges. Do not remove adhesive strips completely unless your health care provider tells you to do that.  Check your incision area every day for signs of infection. Check for: ? More redness, swelling, or pain. ? More fluid or blood. ? Warmth. ? Pus or a bad smell.  Do not take baths, swim, or use a hot tub until your health care provider says it's okay. Ask your health care provider if you can take showers.  When you cough or sneeze, hug a pillow. This helps with pain and decreases the chance of your incision opening up (dehiscing). Do this until your incision heals.   Medicines  Take over-the-counter and prescription medicines only as told by your health care provider.  If you were prescribed an antibiotic medicine, take it as told by your health care provider. Do not stop taking the antibiotic even if you start to feel better.  Do not drive or use heavy machinery while taking prescription pain medicine. Lifestyle  Do not drink alcohol. This is especially important if you are breastfeeding or taking pain medicine.  Do not use any products that contain nicotine or tobacco, such as cigarettes, e-cigarettes, and chewing tobacco. If you need help quitting, ask your health care provider. Eating and drinking  Drink at least 8 eight-ounce glasses of water every day unless told not to by your health care provider. If you breastfeed, you may need to drink even more water.  Eat high-fiber foods every day. These foods may help prevent or relieve constipation. High-fiber  foods include: ? Whole grain cereals and breads. ? Brum rice. ? Beans. ? Fresh fruits and vegetables. Activity  If possible, have someone help you care for your baby and help with household activities for at least a few days after you leave the hospital.  Return to your normal activities as told by your health care provider. Ask your health care provider what activities are safe  for you.  Rest as much as possible. Try to rest or take a nap while your baby is sleeping.  Do not lift anything that is heavier than 10 lbs (4.5 kg), or the limit that you were told, until your health care provider says that it is safe.  Talk with your health care provider about when you can engage in sexual activity. This may depend on your: ? Risk of infection. ? How fast you heal. ? Comfort and desire to engage in sexual activity.   General instructions  Do not use tampons or douches until your health care provider approves.  Wear loose, comfortable clothing and a supportive and well-fitting bra.  Keep your perineum clean and dry. Wipe from front to back when you use the toilet.  If you pass a blood clot, save it and call your health care provider to discuss. Do not flush blood clots down the toilet before you get instructions from your health care provider.  Keep all follow-up visits for you and your baby as told by your health care provider. This is important. Contact a health care provider if:  You have: ? A fever. ? Bad-smelling vaginal discharge. ? Pus or a bad smell coming from your incision. ? Difficulty or pain when urinating. ? A sudden increase or decrease in the frequency of your bowel movements. ? More redness, swelling, or pain around your incision. ? More fluid or blood coming from your incision. ? A rash. ? Nausea. ? Little or no interest in activities you used to enjoy. ? Questions about caring for yourself or your baby.  Your incision feels warm to the touch.  Your breasts turn red or become painful or hard.  You feel unusually sad or worried.  You vomit.  You pass a blood clot from your vagina.  You urinate more than usual.  You are dizzy or light-headed. Get help right away if:  You have: ? Pain that does not go away or get better with medicine. ? Chest pain. ? Difficulty breathing. ? Blurred vision or spots in your vision. ? Thoughts  about hurting yourself or your baby. ? New pain in your abdomen or in one of your legs. ? A severe headache.  You faint.  You bleed from your vagina so much that you fill more than one sanitary pad in one hour. Bleeding should not be heavier than your heaviest period. Summary  After the procedure, it is common to have pain at your incision site, abdominal cramping, and slight bleeding from your vagina.  Check your incision area every day for signs of infection.  Tell your health care provider about any unusual symptoms.  Keep all follow-up visits for you and your baby as told by your health care provider. This information is not intended to replace advice given to you by your health care provider. Make sure you discuss any questions you have with your health care provider. Document Revised: 03/21/2018 Document Reviewed: 03/21/2018 Elsevier Patient Education  2021 [employer].

## 2020-11-16 ENCOUNTER — Ambulatory Visit: Payer: Medicaid Other

## 2020-12-01 ENCOUNTER — Ambulatory Visit: Payer: Medicaid Other | Admitting: Advanced Practice Midwife

## 2020-12-03 ENCOUNTER — Ambulatory Visit (INDEPENDENT_AMBULATORY_CARE_PROVIDER_SITE_OTHER): Payer: Medicaid Other | Admitting: Certified Nurse Midwife

## 2020-12-03 ENCOUNTER — Other Ambulatory Visit: Payer: Self-pay

## 2020-12-03 ENCOUNTER — Encounter: Payer: Self-pay | Admitting: Certified Nurse Midwife

## 2020-12-03 DIAGNOSIS — Z3009 Encounter for other general counseling and advice on contraception: Secondary | ICD-10-CM

## 2020-12-03 NOTE — Progress Notes (Signed)
Post Partum Visit Note  Autumn Morgan is a 25 y.o. [phone] female who presents for a postpartum visit. She is four weeks postpartum following a primary LTCS due to cord prolapse.  I have fully reviewed the prenatal and intrapartum course. The delivery was at 40 gestational weeks.  Anesthesia: Epidual. Postpartum course has been uncomplicated. Baby is doing well doing. Baby is feeding by breast. Bleeding small amount. Bowel function is normal. Bladderfunction is normal. Patient is not sexually active. Contraception method is nothing at this time. Postpartum depression screening: negative.  The pregnancy intention screening data noted above was reviewed. Potential methods of contraception were discussed. The patient elected to proceed with Unknown/Not Reported.    Edinburgh Postnatal Depression Scale - 12/03/20 1039      Edinburgh Postnatal Depression Scale:  In the Past 7 Days   I have been able to laugh and see the funny side of things. 0    I have looked forward with enjoyment to things. 0    I have blamed myself unnecessarily when things went wrong. 0    I have been anxious or worried for no good reason. 0    I have felt scared or panicky for no good reason. 0    Things have been getting on top of me. 0    I have been so unhappy that I have had difficulty sleeping. 0    I have felt sad or miserable. 0    I have been so unhappy that I have been crying. 0    The thought of harming myself has occurred to me. 0    Edinburgh Postnatal Depression Scale Total 0            The following portions of the patient's history were reviewed and updated as appropriate: allergies, current medications, past medical history, past surgical history and problem list.  Review of Systems A comprehensive review of systems was negative.    Objective:  BP 123/84   Pulse 70   Wt 194 lb 6.4 oz (88.2 kg)   LMP 01/30/2020   Breastfeeding Yes   BMI 28.71 kg/m    General:  alert, cooperative and no  distress   Breasts:  inspection negative, no nipple discharge or bleeding, no masses or nodularity palpable  Lungs: clear to auscultation bilaterally  Heart:  regular rate and rhythm  Abdomen: soft, non-tender; bowel sounds normal; no masses,  no organomegaly and C/S incision healed appropriately- no tenderness, redness, or drainage   Vulva:  not evaluated  Vagina: not evaluated  Cervix:  not evaluated  Corpus: not examined  Adnexa:  not evaluated  Rectal Exam: Not performed.        Assessment:  1. Postpartum care and examination - Normal postpartum exam. Pap smear not done at today's visit. Last pap 05/11/20- normal   2. Birth control counseling - Educated and discussed birth control options with patient. Patient is considering IUD but does not want it to be placed at this time. Patient is not having intercourse at this time. Pamphlets handed to patient with information about the IUD. Patient to schedule her appointment for IUD once she makes a decision.  Plan:   Essential components of care per ACOG recommendations:  1.  Mood and well being: Patient with negative depression screening today. Reviewed local resources for support.  - Patient does not use tobacco. - hx of drug use? No    2. Infant care and feeding:  -Patient currently breastmilk feeding?  Yes. Reviewed importance of draining breast regularly to support lactation. -Social determinants of health (SDOH) reviewed in EPIC. No concerns  3. Sexuality, contraception and birth spacing - Patient does not want a pregnancy in the next year.  Desired family size is 2 children.  - Reviewed forms of contraception in tiered fashion. Patient desired unsure at this time today.   - Discussed birth spacing of 18 months  4. Sleep and fatigue -Encouraged family/partner/community support of 4 hrs of uninterrupted sleep to help with mood and fatigue  5. Physical Recovery  - Discussed patients delivery and complications - Patient has  urinary incontinence? No - Patient is safe to resume physical and sexual activity  6.  Health Maintenance - Last pap smear done 05/11/20 and was normal with negative HPV.  Sharyon Cable, CNM [facility] [employer], [facility]

## 2021-01-12 ENCOUNTER — Emergency Department (HOSPITAL_COMMUNITY)
Admission: EM | Admit: 2021-01-12 | Discharge: 2021-01-12 | Disposition: A | Discharge status: Home / Self Care | Payer: Medicaid Other | Attending: Emergency Medicine | Admitting: Emergency Medicine

## 2021-01-12 ENCOUNTER — Emergency Department (HOSPITAL_COMMUNITY): Payer: Medicaid Other

## 2021-01-12 ENCOUNTER — Encounter (HOSPITAL_COMMUNITY): Payer: Self-pay | Admitting: Emergency Medicine

## 2021-01-12 DIAGNOSIS — R739 Hyperglycemia, unspecified: Secondary | ICD-10-CM | POA: Insufficient documentation

## 2021-01-12 DIAGNOSIS — J45909 Unspecified asthma, uncomplicated: Secondary | ICD-10-CM | POA: Diagnosis not present

## 2021-01-12 DIAGNOSIS — N132 Hydronephrosis with renal and ureteral calculous obstruction: Secondary | ICD-10-CM | POA: Insufficient documentation

## 2021-01-12 DIAGNOSIS — N200 Calculus of kidney: Secondary | ICD-10-CM

## 2021-01-12 DIAGNOSIS — R109 Unspecified abdominal pain: Secondary | ICD-10-CM | POA: Diagnosis present

## 2021-01-12 LAB — URINALYSIS, ROUTINE W REFLEX MICROSCOPIC
Bacteria, UA: NONE SEEN
Bilirubin Urine: NEGATIVE
Glucose, UA: NEGATIVE mg/dL
Ketones, ur: NEGATIVE mg/dL
Leukocytes,Ua: NEGATIVE
Nitrite: NEGATIVE
Protein, ur: NEGATIVE mg/dL
RBC / HPF: >50 RBC/hpf — ABNORMAL HIGH (ref 0–5)
Specific Gravity, Urine: 1.024 (ref 1.005–1.030)
pH: 6 (ref 5.0–8.0)

## 2021-01-12 LAB — BASIC METABOLIC PANEL
Anion gap: 8 (ref 5–15)
BUN: 12 mg/dL (ref 6–20)
CO2: 23 mmol/L (ref 22–32)
Calcium: 9 mg/dL (ref 8.9–10.3)
Chloride: 109 mmol/L (ref 98–111)
Creatinine, Ser: 1.05 mg/dL — ABNORMAL HIGH (ref 0.44–1.00)
GFR, Estimated: >60 mL/min (ref >60)
Glucose, Bld: 113 mg/dL — ABNORMAL HIGH (ref 70–99)
Potassium: 4.3 mmol/L (ref 3.5–5.1)
Sodium: 140 mmol/L (ref 135–145)

## 2021-01-12 LAB — CBC
HCT: 42.6 % (ref 36.0–46.0)
Hemoglobin: 13.8 g/dL (ref 12.0–15.0)
MCH: 29.4 pg (ref 26.0–34.0)
MCHC: 32.4 g/dL (ref 30.0–36.0)
MCV: 90.8 fL (ref 80.0–100.0)
Platelets: 234 10*3/uL (ref 150–400)
RBC: 4.69 MIL/uL (ref 3.87–5.11)
RDW: 12.6 % (ref 11.5–15.5)
WBC: 5.8 10*3/uL (ref 4.0–10.5)
nRBC: 0 % (ref 0.0–0.2)

## 2021-01-12 LAB — I-STAT BETA HCG BLOOD, ED (MC, WL, AP ONLY): I-stat hCG, quantitative: <5 m[IU]/mL (ref <5)

## 2021-01-12 MED ORDER — ONDANSETRON HCL 4 MG/2ML IJ SOLN
4.0000 mg | Freq: Once | INTRAMUSCULAR | Status: AC
  Administered 2021-01-12: 4 mg via INTRAVENOUS
  Filled 2021-01-12: qty 2

## 2021-01-12 MED ORDER — HYDROMORPHONE HCL 1 MG/ML IJ SOLN
1.0000 mg | Freq: Once | INTRAMUSCULAR | Status: AC
Start: 2021-01-12 — End: 2021-01-12
  Administered 2021-01-12: 1 mg via INTRAVENOUS
  Filled 2021-01-12: qty 1

## 2021-01-12 MED ORDER — MORPHINE SULFATE (PF) 4 MG/ML IV SOLN
4.0000 mg | Freq: Once | INTRAVENOUS | Status: AC
Start: 2021-01-12 — End: 2021-01-12
  Administered 2021-01-12: 4 mg via INTRAVENOUS
  Filled 2021-01-12: qty 1

## 2021-01-12 MED ORDER — SODIUM CHLORIDE 0.9 % IV SOLN
Freq: Once | INTRAVENOUS | Status: AC
  Administered 2021-01-12: 500 mL via INTRAVENOUS

## 2021-01-12 MED ORDER — HYDROMORPHONE HCL 1 MG/ML IJ SOLN
1.0000 mg | Freq: Once | INTRAMUSCULAR | Status: AC
  Administered 2021-01-12: 1 mg via INTRAVENOUS
  Filled 2021-01-12: qty 1

## 2021-01-12 NOTE — ED Triage Notes (Addendum)
Patient here from home reporting left sided flank pain radiating around to abd with n/v. Denies pregnancy but nursing. 4mg  Zofran and of Fent given EMS.

## 2021-01-12 NOTE — Discharge Instructions (Addendum)
You have a kidney stone, this should pass on its own, I recommend over-the-counter pain medication like ibuprofen and or Tylenol every 6 hours as needed please addition back a bottle.  You had narcotics while in the emergency department these can pass this to your infant through breastmilk you need to stop breast-feeding for the 24 hours to allow the narcotics to exit your system  Please follow-up with urology for further evaluation.  Given contact information above  Come back to the emergency department if you develop chest pain, shortness of breath, severe abdominal pain, uncontrolled nausea, vomiting, diarrhea.

## 2021-01-12 NOTE — ED Notes (Signed)
Unable to get blood ?

## 2021-01-12 NOTE — ED Provider Notes (Signed)
MSE was initiated and I personally evaluated the patient and placed orders (if any) at  2:07 PM on January 12, 2021.  The patient appears stable so that the remainder of the MSE may be completed by another provider. Patient placed in Quick Look pathway, seen and evaluated   Chief Complaint: left flank pain  HPI:   Pt complains of sudden onset of severe flank pain   ROS: not pregant. No vomiting   Physical Exam:   Gen: No distress  Neuro: Awake and Alert  Skin: Warm    Focused Exam: uncomfortable, abdomen soft    Initiation of care has begun. The patient has been counseled on the process, plan, and necessity for staying for the completion/evaluation, and the remainder of the medical screening examination    Osie Cheeks 01/12/21 1408    Koleen Distance, MD 01/12/21 [phone]

## 2021-01-12 NOTE — ED Provider Notes (Signed)
[facility] DEPT Provider Note   CSN: [id number] Arrival date & time: 01/12/21  1334     History Chief Complaint  Patient presents with  . Flank Pain  . Abdominal Pain  . Nausea  . Emesis    Autumn Morgan is a 25 y.o. female.  HPI   Patient with significant medical history of flank pain, abdominal pain, presents with chief complaint of left-sided flank pain.  Patient states pain came on suddenly around 10 AM this morning while she is walking, describes the pain as a sharp stabbing-like sensation, it waxes and wanes in intensity, has associated nausea without vomiting, she denies urinary symptoms vaginal discharge or vaginal bleeding, constipation, diarrhea, has never experienced in the past.  Patient has no significant abdominal history, no history of kidney stones, stomach ulcers, bowel obstructions.  Patient has tried taking Tylenol without any significant relief.  Patient denies headaches, fevers, chills, shortness of breath, chest pain, vomiting, diarrhea, constipation, worsening pedal edema.  Past Medical History:  Diagnosis Date  . Asthma   . Low-lying placenta 08/18/2020   Resolved based on [place] on 1/4 - placenta anterior right   . Polydactyly, fetal, affecting care of mother, antepartum 08/08/2020  . Post-dates pregnancy 11/06/2020  . Rh negative state in antepartum period 08/18/2020  . Supervision of normal pregnancy, antepartum 05/04/2020    Nursing Staff Provider Office Location  [place]  Dating  LMP Language  ENGLISH  Anatomy US  bilateral polydactyly Previa> 1cm from os on 11/10 Flu Vaccine  DECLINED   Genetic Screen  NIPS: Low risk, female    AFP:   Not done  TDaP vaccine  08/04/20 Hgb A1C or  GTT Early  Third trimester    Ref. Range 08/04/2020 10:21 Glucose, 1 hour Latest Ref Range: 65 - 179 mg/dL 85 Glucose, Fasting Latest Ref Range    Patient Active Problem List   Diagnosis Date Noted  . Cesarean delivery delivered 11/08/2020  . GBS (group B  streptococcus) UTI complicating pregnancy 07/20/8526  . Chronic seasonal allergic rhinitis 11/21/2016  . Asthma 10/10/2016  . History of lung abscess 04/29/2010    Past Surgical History:  Procedure Laterality Date  . CESAREAN SECTION N/A 11/06/2020   Procedure: CESAREAN SECTION;  Surgeon: Chancy Milroy, MD;  Location: [facility] LD [facility]S;  Service: Obstetrics;  Laterality: N/A;     OB History    Gravida  4   Para  1   Term  1   Preterm      AB  3   Living  1     SAB      IAB  3   Ectopic      Multiple  0   Live Births  1           Family History  Problem Relation Age of Onset  . Hypertension Mother     Social History   Tobacco Use  . Smoking status: Never Smoker  . Smokeless tobacco: Never Used  Vaping Use  . Vaping Use: Never used  Substance Use Topics  . Alcohol use: Not Currently  . Drug use: Not Currently    Home Medications Prior to Admission medications   Medication Sig Start Date End Date Taking? Authorizing Provider  acetaminophen (TYLENOL) 500 MG tablet Take 2 tablets (1,000 mg total) by mouth every 6 (six) hours as needed. Patient not taking: Reported on 12/03/2020 11/08/20 7/82/42  Arrie Senate, MD  albuterol (VENTOLIN HFA) [phone] Base)  MCG/ACT inhaler Inhale 2 puffs into the lungs every 6 (six) hours as needed for wheezing or shortness of breath. Patient not taking: Reported on 12/03/2020    [provider]  Blood Pressure Monitor KIT 1 Device by Does not apply route once a week. To be monitored Regularly at home. Patient not taking: Reported on 12/03/2020 05/04/20   Woodroe Mode, MD  coconut oil OIL Apply 1 application topically as needed. Patient not taking: Reported on 12/03/2020 7/32/20   Arrie Senate, MD  ferrous sulfate 325 (65 FE) MG tablet Take 1 tablet (325 mg total) by mouth every other day. Patient not taking: Reported on 12/03/2020 2/54/27   Arrie Senate, MD  ibuprofen (ADVIL) 600 MG tablet Take 1 tablet  (600 mg total) by mouth every 6 (six) hours as needed. Patient not taking: Reported on 12/03/2020 0/62/37   Arrie Senate, MD  oxyCODONE (ROXICODONE) 5 MG immediate release tablet Take 1 tablet (5 mg total) by mouth every 4 (four) hours as needed for severe pain. Patient not taking: Reported on 12/03/2020 03/23/30   Arrie Senate, MD  Prenat-Fe Poly-Methfol-FA-DHA (VITAFOL ULTRA) 29-0.6-0.4-200 MG CAPS Take 1 capsule by mouth daily before breakfast. Patient not taking: Reported on 12/03/2020 05/11/20   Shelly Bombard, MD    Allergies    Penicillins  Review of Systems   Review of Systems  Constitutional: Negative for chills and fever.  HENT: Negative for congestion.   Respiratory: Negative for shortness of breath.   Cardiovascular: Negative for chest pain.  Gastrointestinal: Positive for abdominal pain and nausea. Negative for constipation, diarrhea and vomiting.  Genitourinary: Positive for flank pain. Negative for dysuria and enuresis.  Musculoskeletal: Negative for back pain.  Skin: Negative for rash.  Neurological: Negative for headaches.  Hematological: Does not bruise/bleed easily.    Physical Exam Updated Vital Signs BP 124/84   Pulse (!) 56   Temp 98 F (36.7 C) (Oral)   Resp 18   SpO2 100%   Physical Exam Vitals and nursing note reviewed.  Constitutional:      General: She is not in acute distress.    Appearance: She is not ill-appearing.  HENT:     Head: Normocephalic and atraumatic.     Nose: No congestion.  Eyes:     Conjunctiva/sclera: Conjunctivae normal.  Cardiovascular:     Rate and Rhythm: Normal rate and regular rhythm.     Pulses: Normal pulses.     Heart sounds: No murmur heard. No friction rub. No gallop.   Pulmonary:     Effort: No respiratory distress.     Breath sounds: No wheezing, rhonchi or rales.  Abdominal:     Palpations: Abdomen is soft.     Tenderness: There is abdominal tenderness. There is left CVA tenderness. There is  no right CVA tenderness.     Comments: Abdomen was visualized it was nondistended, normal active bowel sounds, dull to percussion.  She is notably tender along her left flank, negative rebound tenderness, peritoneal signs, no guarding, negative Murphy sign no McBurney point.  Patient positive left CVA tenderness  Musculoskeletal:     Right lower leg: No edema.     Left lower leg: No edema.  Skin:    General: Skin is warm and dry.  Neurological:     Mental Status: She is alert.  Psychiatric:        Mood and Affect: Mood normal.     ED Results / Procedures /  Treatments   Labs (all labs ordered are listed, but only abnormal results are displayed) Labs Reviewed  URINALYSIS, ROUTINE W REFLEX MICROSCOPIC - Abnormal; Notable for the following components:      Result Value   Hgb urine dipstick MODERATE (*)    RBC / HPF >50 (*)    All other components within normal limits  BASIC METABOLIC PANEL - Abnormal; Notable for the following components:   Glucose, Bld 113 (*)    Creatinine, Ser 1.05 (*)    All other components within normal limits  URINE CULTURE  CBC  I-STAT BETA HCG BLOOD, ED ([facility], [facility], [facility] ONLY)    EKG None  Radiology CT Renal Stone Study  Result Date: 01/12/2021 CLINICAL DATA:  Left flank pain. EXAM: CT ABDOMEN AND PELVIS WITHOUT CONTRAST TECHNIQUE: Multidetector CT imaging of the abdomen and pelvis was performed following the standard protocol without IV contrast. COMPARISON:  None. FINDINGS: Lower chest: The lung bases are clear of acute process. No pleural effusion or pulmonary lesions. The heart is normal in size. No pericardial effusion. The distal esophagus and aorta are unremarkable. Hepatobiliary: No hepatic lesions or intrahepatic biliary dilatation. The gallbladder is unremarkable. No common bile duct dilatation. Pancreas: No mass, inflammation ductal dilatation. Spleen: Normal size.  No focal lesions. Adrenals/Urinary Tract: Adrenal glands are normal. The right kidney  is unremarkable. No right renal calculi hydroureteronephrosis. The left kidney demonstrates findings of a high-grade obstruction with perinephric interstitial changes and fluid and moderate left-sided hydroureteronephrosis all the way down to a 2-3 mm calculus at the tip of the UVJ. Stomach/Bowel: The stomach, duodenum, small bowel and colon are grossly normal without oral contrast. No inflammatory changes, mass lesions or obstructive findings. The appendix is normal. Vascular/Lymphatic: The aorta is normal in caliber. No atheroscerlotic calcifications. No mesenteric of retroperitoneal mass or adenopathy. Small scattered lymph nodes are noted. Reproductive: The uterus and ovaries are unremarkable. Other: No pelvic mass or adenopathy. No free pelvic fluid collections. No inguinal mass or adenopathy. No abdominal wall hernia or subcutaneous lesions. Musculoskeletal: No significant bony findings. IMPRESSION: 1. 2 mm left UVJ calculus causing moderate left-sided hydroureteronephrosis. 2. No other significant abdominal/pelvic findings, mass lesions or adenopathy. Electronically Signed   By: Marijo Sanes M.D.   On: 01/12/2021 18:10    Procedures Procedures   Medications Ordered in ED Medications  ondansetron (ZOFRAN) injection 4 mg (4 mg Intravenous Given 01/12/21 1412)  HYDROmorphone (DILAUDID) injection 1 mg (1 mg Intravenous Given 01/12/21 1412)  morphine 4 MG/ML injection 4 mg (4 mg Intravenous Given 01/12/21 1546)  0.9 %  sodium chloride infusion ( Intravenous Stopped 01/12/21 1649)  ondansetron (ZOFRAN) injection 4 mg (4 mg Intravenous Given 01/12/21 1546)  HYDROmorphone (DILAUDID) injection 1 mg (1 mg Intravenous Given 01/12/21 1708)    ED Course  I have reviewed the triage vital signs and the nursing notes.  Pertinent labs & imaging results that were available during my care of the patient were reviewed by me and considered in my medical decision making (see chart for details).    MDM  Rules/Calculators/A&P                         Initial impression-patient presents with left-sided flank pain.  She is alert, does not appear in acute distress, vital signs reassuring.  Concern for kidney stone versus diverticulitis, will obtain basic lab work, provide patient fluids, antiemetics, pain medications and reassess.  Work-up-CBC unremarkable, BMP shows hyperglycemia 113,  creatinine 1.05.  Urine pregnancy less than 5, UA shows no nitrates or leukocytes, shows many red blood cells.  CT renal reveals 2 mm left UVJ calculus causing moderate left-sided hydronephrosis  Reassessment patient was reassessed after providing her with pain medications, patient states she feels much better, has no complaints this time.  Vital signs have remained stable, all questions have been answered patient agreed for discharge at this time.  Patient notified me as I was leaving the room that she is currently breast-feeding, will speak with pharmacy to determine how long patient needs to wait before she can breast-feed again.  will also asked to see what would be the best pain medications to provide the patient.  Pharmacy advised that patient withhold her breastmilk for next 24 hours to allow the narcotics to metabolize.  Recommend that patient takes ibuprofen and Tylenol as needed for pain.  Patient is agreeable to this plan.  Rule out-low suspicion for systemic infection as patient is nontoxic-appearing, vital signs reassuring, no obvious infection on exam.  Low suspicion for UTI or pyelonephritis as UA is negative for signs of infection, there is no noted stranding around the kidneys on CT renal.  Low suspicion for septic stone as UA is negative for infection.  Low suspicion for ectopic pregnancy as patient has a negative urine pregnancy.  Low suspicion for ovarian torsion as history is atypical, patient has no left lower pelvic pain.  Plan-suspect patient's pain is secondary due to a kidney stone.  Will provide  patient with narcotics, Zofran, have her follow-up with urology for further evaluation.  Vital signs have remained stable, no indication for hospital admission. Patient given at home care as well strict return precautions.  Patient verbalized that they understood agreed to said plan.   Final Clinical Impression(s) / ED Diagnoses Final diagnoses:  Kidney stone    Rx / DC Orders ED Discharge Orders    None       Marcello Fennel, PA-C 01/12/21 1914    Varney Biles, MD 01/19/21 [phone]

## 2021-01-13 ENCOUNTER — Encounter: Payer: Self-pay | Admitting: General Practice

## 2021-01-13 ENCOUNTER — Ambulatory Visit: Payer: Medicaid Other

## 2021-01-14 LAB — URINE CULTURE

## 2021-02-01 NOTE — Telephone Encounter (Signed)
Autumn Morgan, Could you let her know that she should not use the birth control patch while she is breast feeding as the estrogen will decrease her milk supply.  If she wants to use contraception right now, she needs to consider progesterone methods like the micronor pill or Depo Provera or an IUD.  She does not have to be seen again to start one of these methods (unless it is an IUD).  Thanks.

## 2021-02-25 ENCOUNTER — Encounter: Payer: Self-pay | Admitting: *Deleted

## 2021-02-27 ENCOUNTER — Other Ambulatory Visit: Payer: Self-pay | Admitting: Obstetrics and Gynecology

## 2021-02-27 MED ORDER — SLYND 4 MG PO TABS: 1.0000 | ORAL_TABLET | Freq: Every day | ORAL | 11 refills | Status: DC

## 2021-03-07 ENCOUNTER — Other Ambulatory Visit: Payer: Self-pay | Admitting: Obstetrics and Gynecology

## 2021-03-07 MED ORDER — NORETHINDRONE 0.35 MG PO TABS: 1.0000 | ORAL_TABLET | Freq: Every day | ORAL | 11 refills | Status: DC

## 2021-08-25 IMAGING — US US MFM OB FOLLOW-UP
1 series · 13 of 28 positions shown · non-contrast
Comparison: none

[Series 1: us mfm ob follow-up · 13 of 56 slices shown]
[im 3/56]
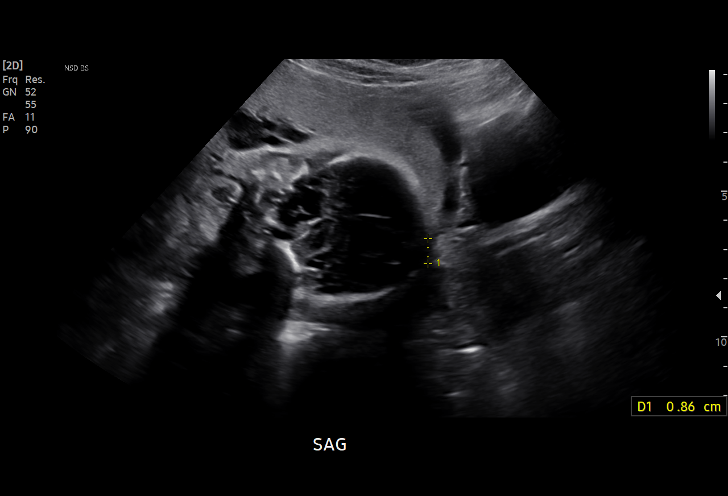
[im 7/56]
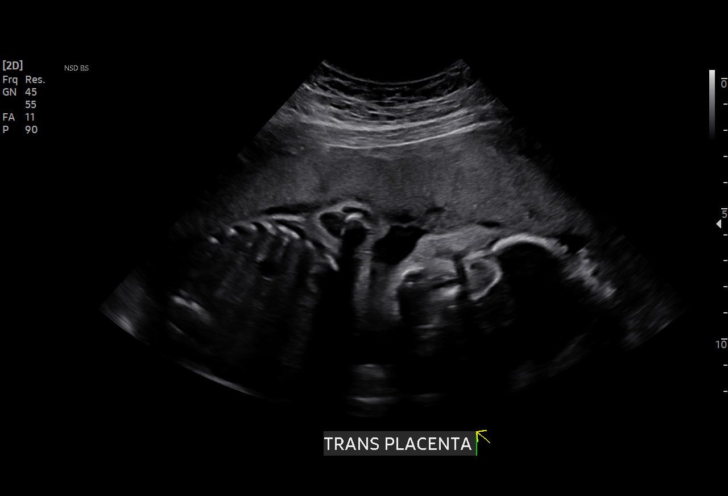
[im 11/56]
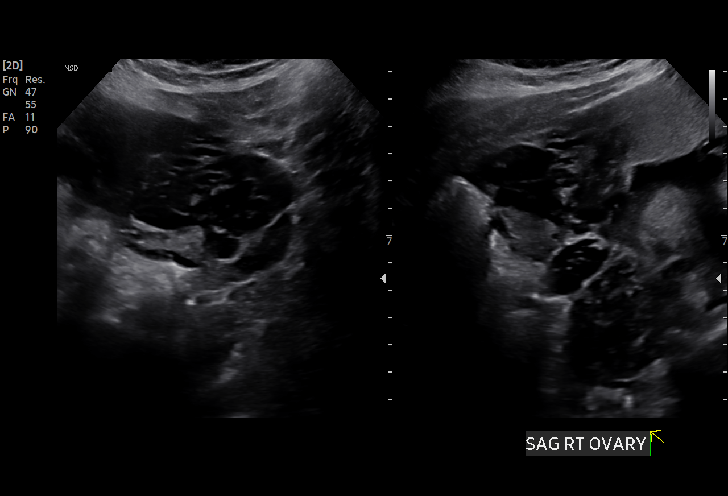
[im 15/56]
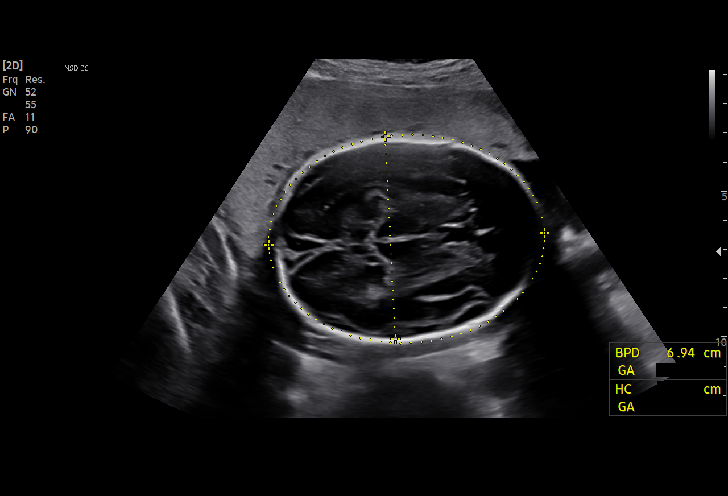
[im 19/56]
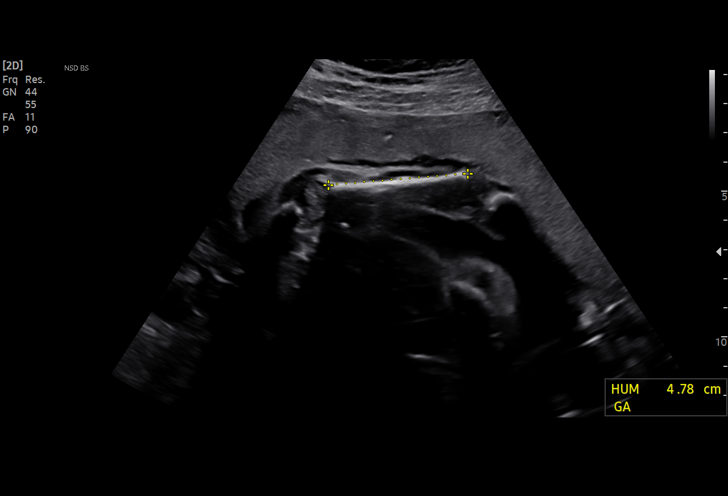
[im 23/56]
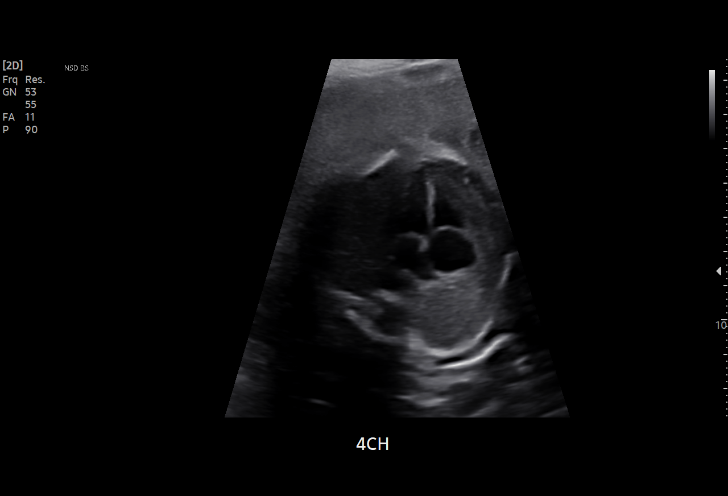
[im 29/56]
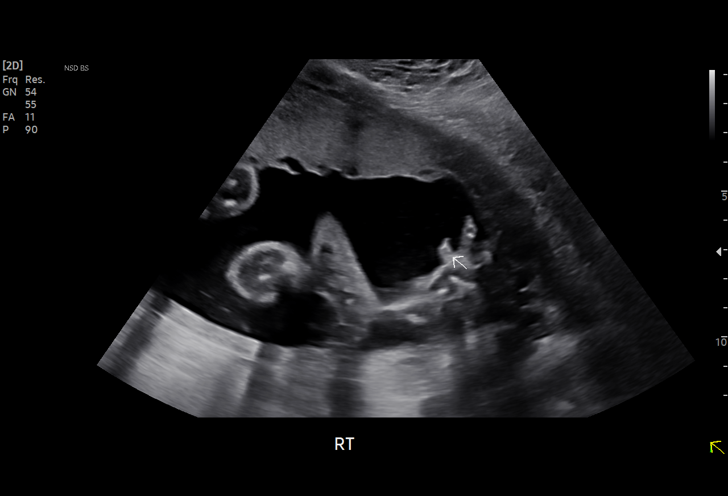
[im 33/56]
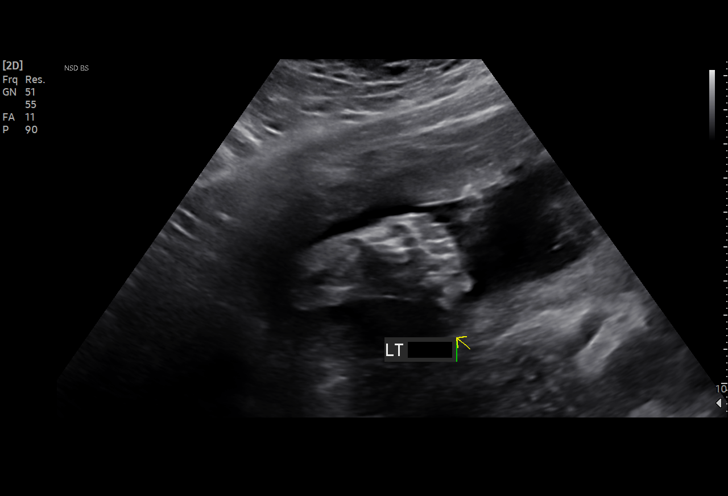
[im 37/56]
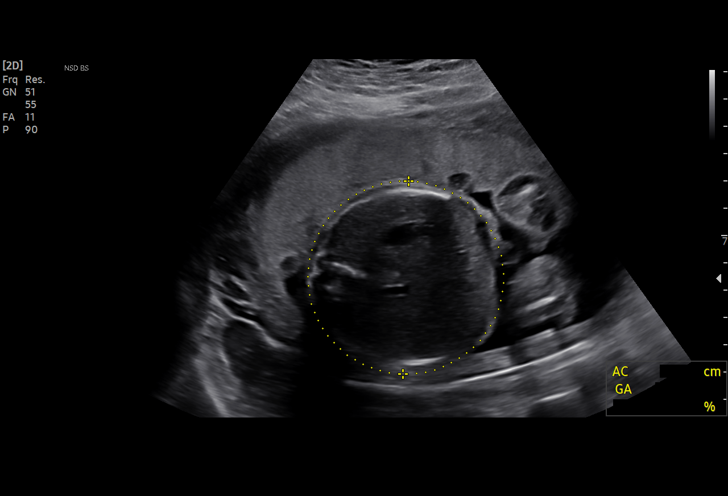
[im 41/56]
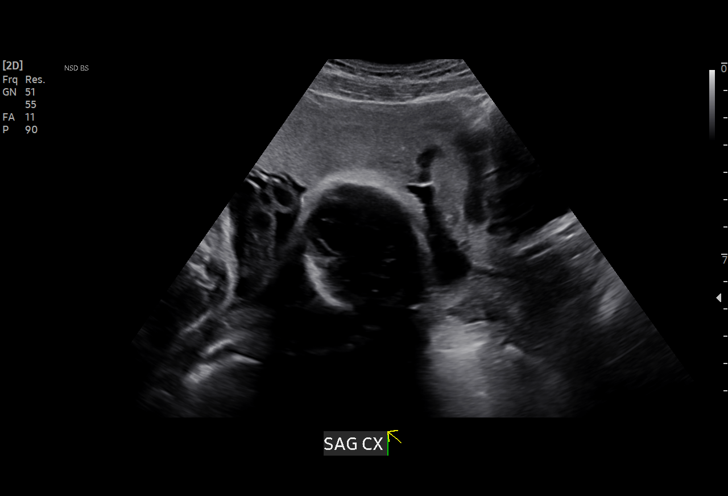
[im 45/56]
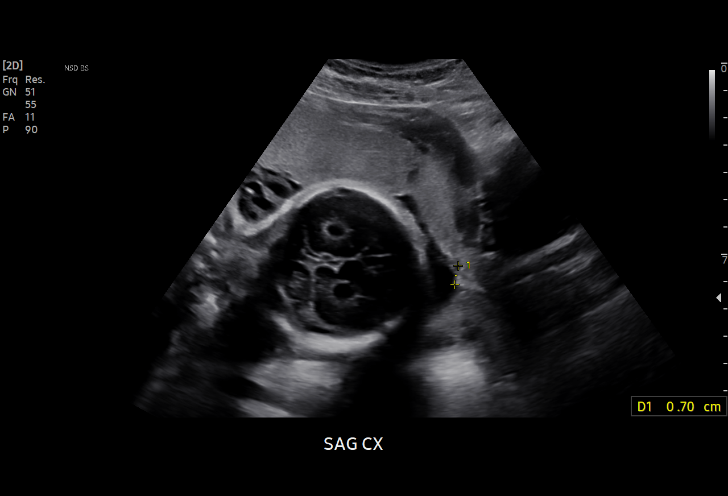
[im 49/56]
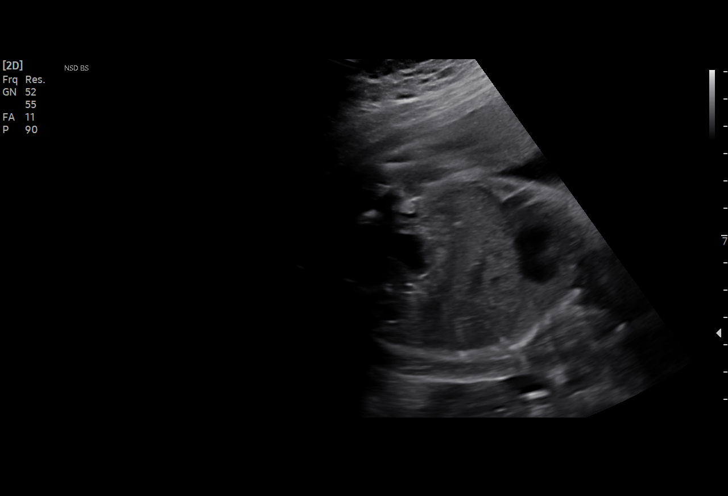
[im 53/56]
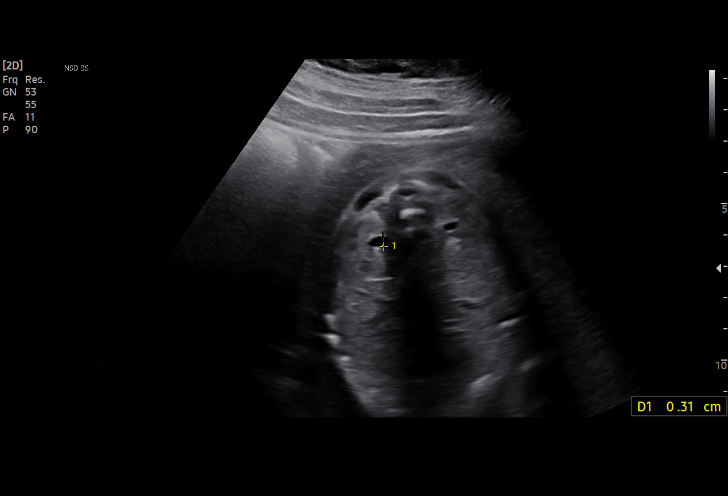

[13 of 28 positions shown; findings below may reference images not displayed]

Indications

 Encounter for antenatal screening for
 malformations
 26 weeks gestation of pregnancy
 Low Risk NIPS   NEG Horizon
 Placenta previa specified as without
 hemorrhage, second trimester
 Fetal polydactyly (Bilateral)
Fetal Evaluation

 Num Of Fetuses:          1
 Fetal Heart Rate(bpm):   160
 Cardiac Activity:        Observed
 Presentation:            Cephalic
 Placenta:                Anterior, low-lying, 0.7cm from int os
 P. Cord Insertion:       Previously Visualized

 Amniotic Fluid
 AFI FV:      Within normal limits

                             Largest Pocket(cm)

Biometry
 BPD:      69.3  mm     G. Age:  27w 6d         73  %    CI:        70.93   %    70 - 86
                                                         FL/HC:       20.6  %    18.6 -
 HC:      262.2  mm     G. Age:  28w 4d         77  %    HC/AC:       1.18       1.05 -
 AC:      222.8  mm     G. Age:  26w 5d         37  %    FL/BPD:      77.8  %    71 - 87
 FL:       53.9  mm     G. Age:  28w 4d         83  %    FL/AC:       24.2  %    20 - 24
 HUM:      48.6  mm     G. Age:  28w 4d         82  %

 Est. FW:    8964   gm     2 lb 7 oz     69  %
OB History

 Gravidity:    4         Term:   0        Prem:   0        SAB:   0
 TOP:          3       Ectopic:  0        Living: 0
Gestational Age

 LMP:           26w 6d        Date:  01/30/20                 EDD:   11/05/20
 U/S Today:     28w 0d                                        EDD:   10/28/20
 Best:          26w 6d     Det. By:  LMP  (01/30/20)          EDD:   11/05/20
Anatomy

 Cranium:               Appears normal         Aortic Arch:            Previously seen
 Cavum:                 Previously seen        Ductal Arch:            Previously seen
 Ventricles:            Appears normal         Diaphragm:              Appears normal
 Choroid Plexus:        Previously seen        Stomach:                Appears normal, left
                                                                       sided
 Cerebellum:            Previously seen        Abdomen:                Appears normal
 Posterior Fossa:       Previously seen        Abdominal Wall:         Previously seen
 Nuchal Fold:           Previously seen        Cord Vessels:           Previously seen
 Face:                  Orbits and profile     Kidneys:                Appear normal
                        previously seen
 Lips:                  Previously seen        Bladder:                Appears normal
 Thoracic:              Appears normal         Spine:                  Previously seen
 Heart:                 Appears normal         Upper Extremities:      Bilateral
                        (4CH, axis, and
                        situs)
                                                                       Polydactyly

 RVOT:                  Previously seen        Lower Extremities:      Previously seen
 LVOT:                  Previously seen

 Other:  Male gender previously seen. Heels and right 5th digit previously
         visualized. 3VV and 3VTV previously visualized. Nasal bone
         previously visualized.
Cervix Uterus Adnexa

 Cervix
 Not visualized (advanced GA >52wks)

 Right Ovary
 Within normal limits.

 Left Ovary
 Not visualized.

 Adnexa
 No abnormality visualized.
Impression

 Follow up growth due to to assess the placenta and to clear
 prior suboptimal images.
 Normal interval growth with measurements consistent with
 dates
 Good fetal movement and amniotic fluid volume

 Today we observed bilateral polydactyly, polydactyly, also
 known as hyperdactyly, is a physical condition of having
 supernumerary fingers or toes, the latter being this case. The
 extra digit is usually a small piece of soft tissue that can be
 removed. Occasionally it contains bone without joints; rarely it
 may be a complete, functioning digit. Polydactyly is present in
 1/9000 live births and results from defective embryologic
 differentiation of the digits with mesodermal rays that persist
 beyond programmed cell death, producing an extra digit.

 Polydactyly is an isolated finding in 85% of cases.
 Polydactyly is classified as: (1) preaxialextra digit located on
 the radial or tibial side of the distal extremity; (2)
 postaxialextra digit on ulnar or fibular side of distal extremity;
 10 times more common in African Americans, occurring in
 1/300 live births (probably an autosomal dominant trait) or (3)
 centrala rare finding, with one or more extranumerary middle
 digits.  The extra digit can be an inherited trait, the result of
 teratogen exposure (e.g., diabetic embryopathy, valproic
 acid), or can be a component of a recognizable  group of
 syndromes. Perinatal morbidity and mortality related to an
 isolated limb defect is low. All questions were answered.

 In review of today's exam the FOB shared that he has a
 brother with polydactyly.

 Lastly, we reviewed that the placental edge is ^1 cm from the
 internal os.
Recommendations

 Follow up growth and placental location in 4 weeks
 Consider TV US to visualize the placental location.

## 2021-10-19 IMAGING — US US MFM OB FOLLOW-UP
1 series · 13 of 28 positions shown · non-contrast
Comparison: none

[Series 1: us mfm ob follow-up · 60 acquisitions, 13 frames shown]
[im 3/60]
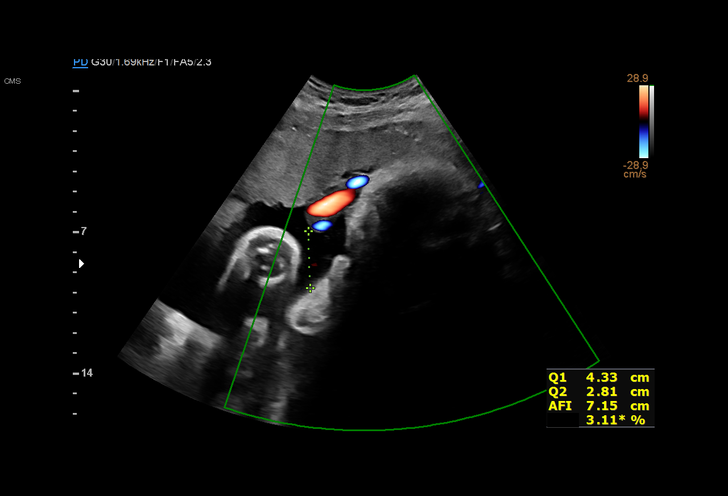
[im 7/60]
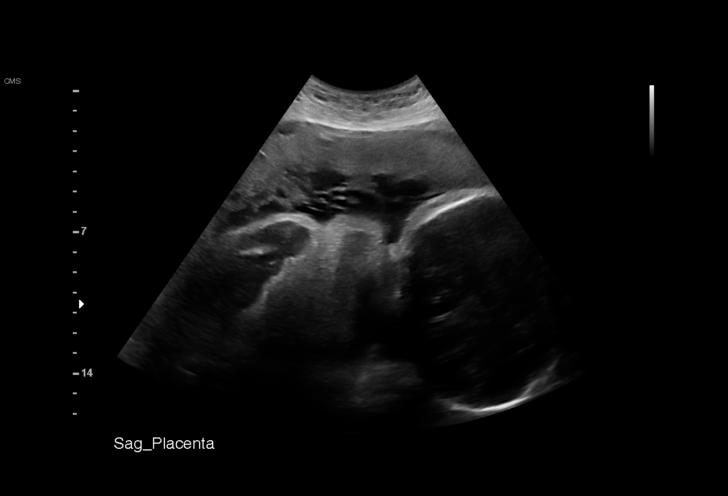
[im 11/60]
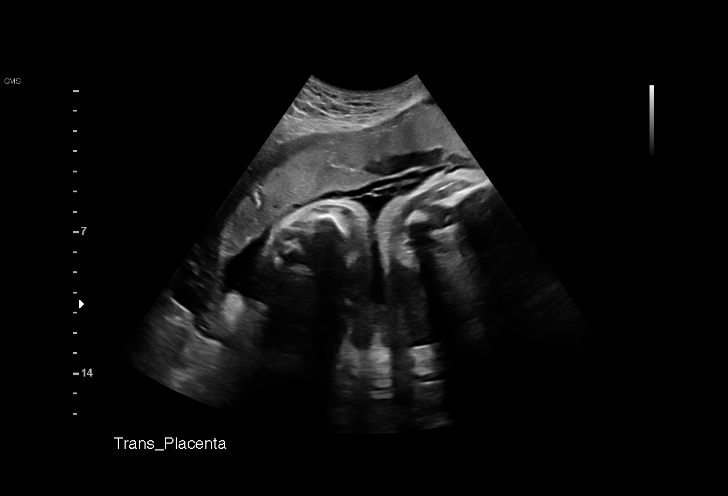
[im 16/60]
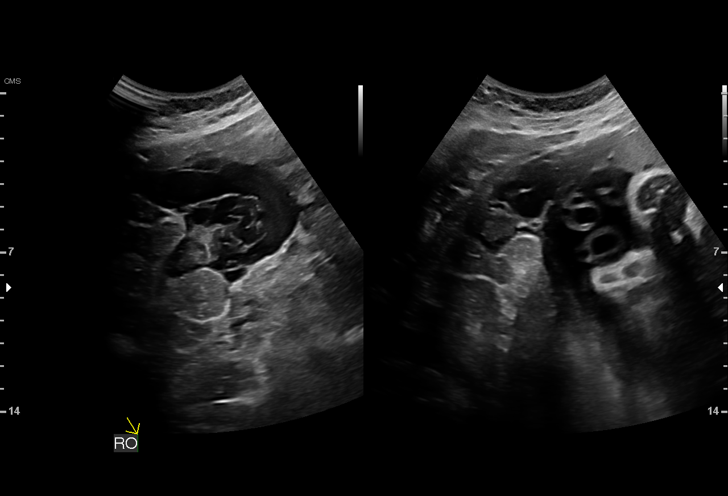
[im 20/60]
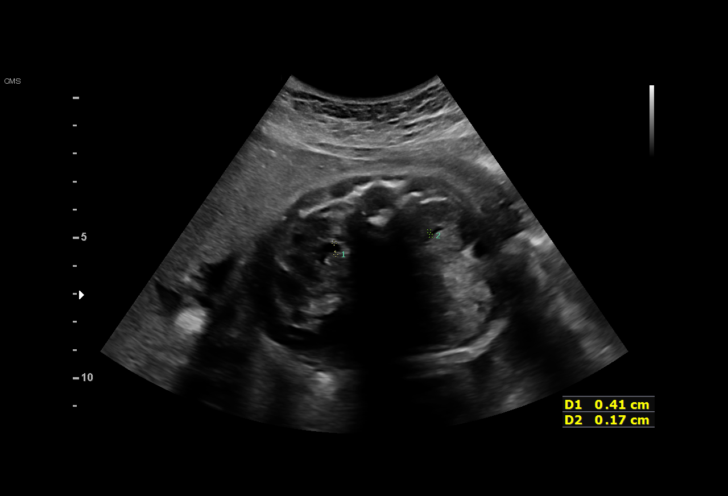
[im 25/60]
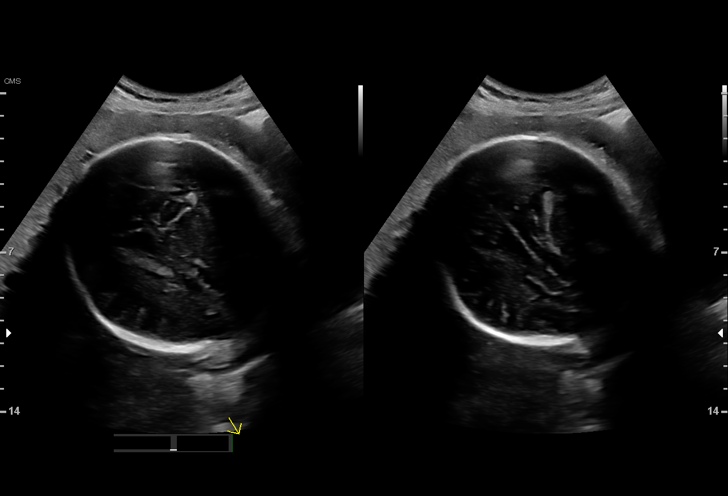
[im 31/60]
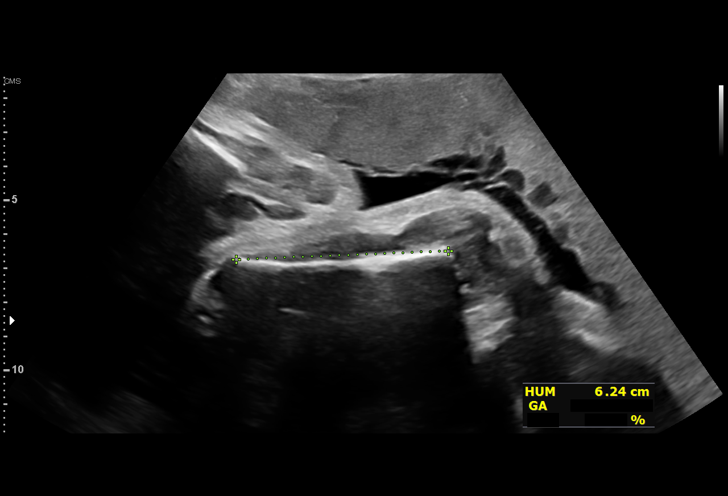
[im 35/60]
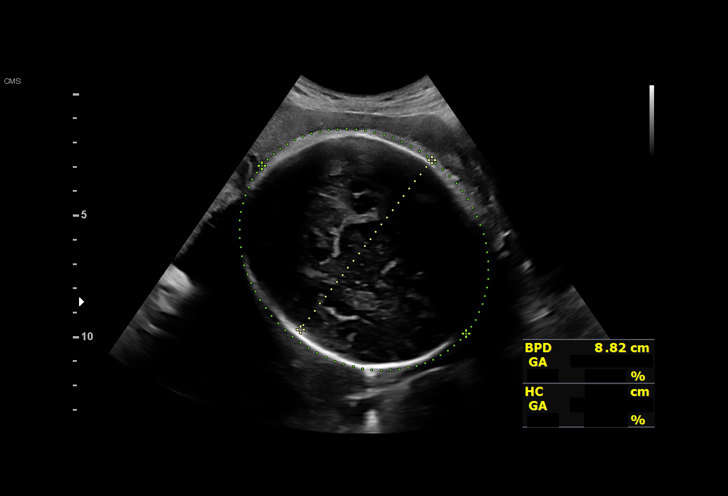
[im 40/60]
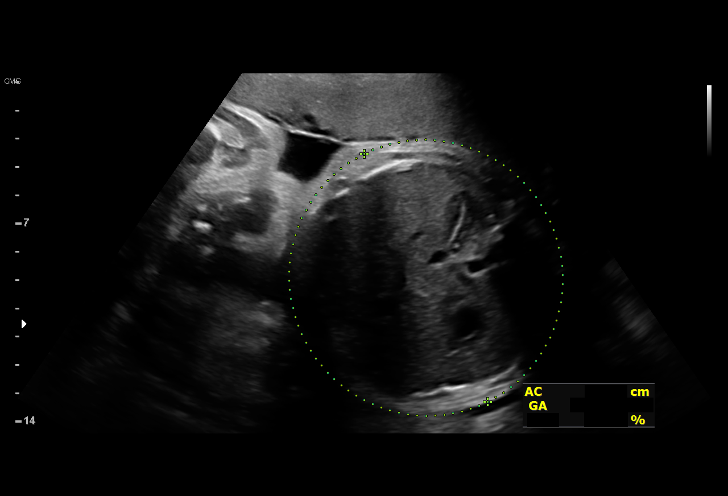
[im 44/60]
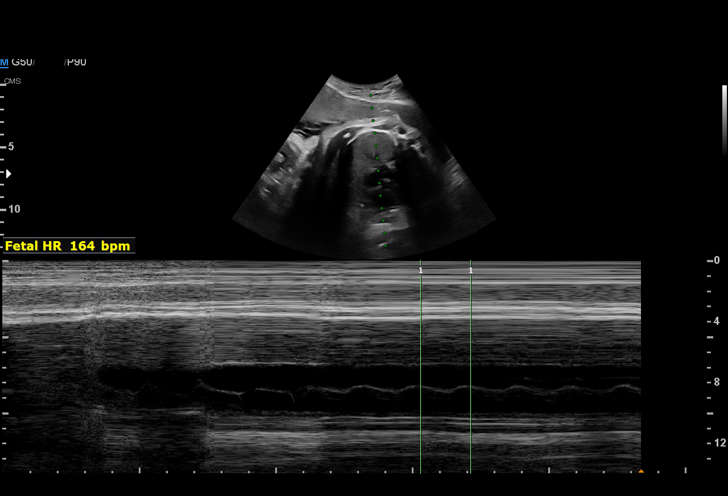
[im 49/60]
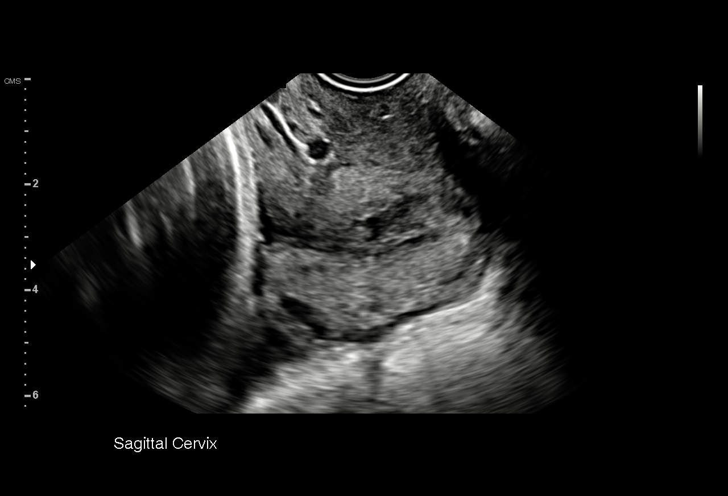
[im 53/60]
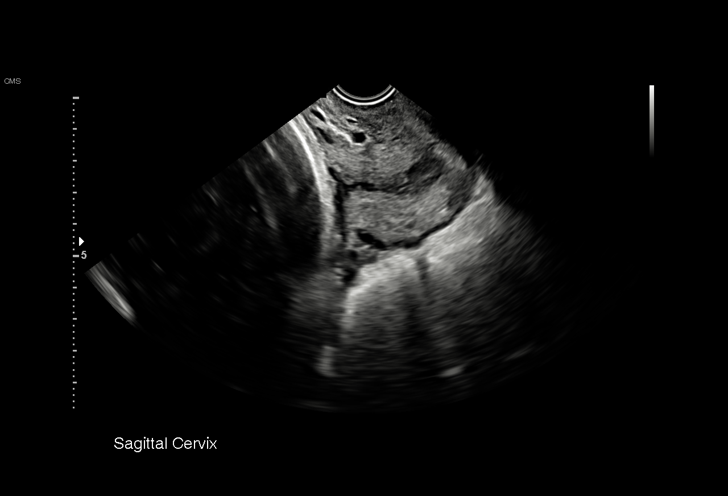
[im 57/60]
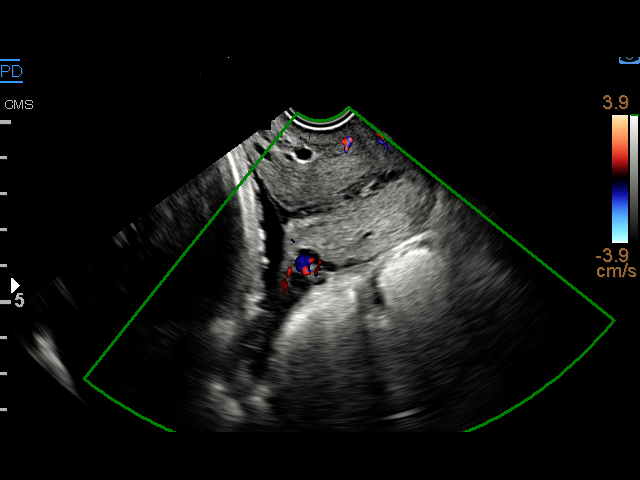

[13 of 28 positions shown; findings below may reference images not displayed]

Indications

 Encounter for other antenatal screening
 follow-up
 Placenta previa specified as without
 hemorrhage, third trimester
 Fetal polydactyly (Bilateral)
 Low Risk NIPS   NEG Horizon
 Rh negative state in antepartum
 34 weeks gestation of pregnancy
Fetal Evaluation

 Num Of Fetuses:         1
 Fetal Heart Rate(bpm):  164
 Cardiac Activity:       Observed
 Presentation:           Cephalic
 Placenta:               Anterior Right
 P. Cord Insertion:      Visualized

 Amniotic Fluid
 AFI FV:      Within normal limits

 AFI Sum(cm)     %Tile       Largest Pocket(cm)
 15.5            56

 RUQ(cm)       RLQ(cm)       LUQ(cm)        LLQ(cm)

Biometry

 BPD:      89.1  mm     G. Age:  36w 0d         85  %    CI:         79.3   %    70 - 86
                                                         FL/HC:      21.6   %    20.1 -
 HC:      316.3  mm     G. Age:  35w 4d         35  %    HC/AC:      1.04        0.93 -
 AC:      305.4  mm     G. Age:  34w 3d         49  %    FL/BPD:     76.5   %    71 - 87
 FL:       68.2  mm     G. Age:  35w 0d         51  %    FL/AC:      22.3   %    20 - 24
 HUM:      61.8  mm     G. Age:  35w 5d         85  %

 LV:        5.8  mm

 Est. FW:    0727  gm    5 lb 10 oz      51  %
OB History

 Gravidity:    4         Term:   0        Prem:   0        SAB:   0
 TOP:          3       Ectopic:  0        Living: 0
Gestational Age

 LMP:           34w 5d        Date:  01/30/20                 EDD:   11/05/20
 U/S Today:     35w 2d                                        EDD:   11/01/20
 Best:          34w 5d     Det. By:  LMP  (01/30/20)          EDD:   11/05/20
Anatomy

 Cranium:               Appears normal         Aortic Arch:            Appears normal
 Cavum:                 Previously seen        Ductal Arch:            Previously seen
 Ventricles:            Appears normal         Diaphragm:              Previously seen
 Choroid Plexus:        Appears normal         Stomach:                Appears normal, left
                                                                       sided
 Cerebellum:            Appears normal         Abdomen:                Appears normal
 Posterior Fossa:       Previously seen        Abdominal Wall:         Previously seen
 Nuchal Fold:           Previously seen        Cord Vessels:           Previously seen
 Face:                  Orbits and profile     Kidneys:                Appear normal
                        previously seen
 Lips:                  Previously seen        Bladder:                Appears normal
 Thoracic:              Appears normal         Spine:                  Previously seen
 Heart:                 Previously seen        Upper Extremities:      Bilateral
                                                                       Polydactyly prev
                                                                       vis
 RVOT:                  Appears normal         Lower Extremities:      Previously seen
 LVOT:                  Previously seen

 Other:  Male gender previously seen. Heels and right 5th digit previously
         visualized. 3VV and 3VTV previously visualized. Nasal bone
         previously visualized.
Cervix Uterus Adnexa

 Cervix
 Length:            3.5  cm.
 Normal appearance by transvaginal scan

 Uterus
 No abnormality visualized.
 Right Ovary
 Within normal limits.

 Left Ovary
 Within normal limits.

 Cul De Sac
 No free fluid seen.

 Adnexa
 No abnormality visualized.
Impression

 Follow up growth and placental location, which is no longer
 low lying.
 Normal interval growth with measurements consistent with
 dates
 Good fetal movement and amniotic fluid volume

 Known polydactyly but not visualized today.
Recommendations

 Follow up as clinically indicated.

## 2021-12-14 ENCOUNTER — Encounter: Payer: Self-pay | Admitting: Nurse Practitioner

## 2021-12-14 ENCOUNTER — Ambulatory Visit (INDEPENDENT_AMBULATORY_CARE_PROVIDER_SITE_OTHER): Payer: Medicaid Other | Admitting: Nurse Practitioner

## 2021-12-14 VITALS — BP 112/72 | HR 69 | Temp 97.0°F | Ht 68.25 in | Wt 186.8 lb

## 2021-12-14 DIAGNOSIS — Z0000 Encounter for general adult medical examination without abnormal findings: Secondary | ICD-10-CM | POA: Diagnosis not present

## 2021-12-14 LAB — COMPREHENSIVE METABOLIC PANEL
ALT: 9 U/L (ref 0–35)
AST: 16 U/L (ref 0–37)
Albumin: 3.9 g/dL (ref 3.5–5.2)
Alkaline Phosphatase: 111 U/L (ref 39–117)
BUN: 12 mg/dL (ref 6–23)
CO2: 28 mEq/L (ref 19–32)
Calcium: 9.2 mg/dL (ref 8.4–10.5)
Chloride: 104 mEq/L (ref 96–112)
Creatinine, Ser: 0.79 mg/dL (ref 0.40–1.20)
GFR: 104.31 mL/min (ref >60.00)
Glucose, Bld: 81 mg/dL (ref 70–99)
Potassium: 4.7 mEq/L (ref 3.5–5.1)
Sodium: 137 mEq/L (ref 135–145)
Total Bilirubin: 0.4 mg/dL (ref 0.2–1.2)
Total Protein: 7.6 g/dL (ref 6.0–8.3)

## 2021-12-14 LAB — CBC
HCT: 38.7 % (ref 36.0–46.0)
Hemoglobin: 12.9 g/dL (ref 12.0–15.0)
MCHC: 33.3 g/dL (ref 30.0–36.0)
MCV: 89 fl (ref 78.0–100.0)
Platelets: 283 10*3/uL (ref 150.0–400.0)
RBC: 4.35 Mil/uL (ref 3.87–5.11)
RDW: 13.3 % (ref 11.5–15.5)
WBC: 3.5 10*3/uL — ABNORMAL LOW (ref 4.0–10.5)

## 2021-12-14 LAB — TSH: TSH: 1.55 u[IU]/mL (ref 0.35–5.50)

## 2021-12-14 NOTE — Progress Notes (Signed)
? ?Complete physical exam ? ?Patient: Autumn Morgan   DOB: [birth year]   25 y.o. Female  MRN: [id number] ?Visit Date: 12/14/2021 ? ?Subjective:  ?  ?Chief Complaint  ?Patient presents with  ? Establish Care  ?  New patient-Physical-No breast or pap exam needed.  ?Pt is fasting.  ?No other questions or concerns  ? ? ?Autumn Morgan is a 26 y.o. female who presents today for a complete physical exam. She reports consuming a general diet. Exercise 20-69mns daily of walking and 1x/week of boxing. She generally feels well. She reports sleeping fairly well due to infant's sleep schedule. She does have additional problems to discuss today.  ?Vision:No ?Dental:Within Last 6 months ?STD Screen:No ?She needs to verify if HPV vaccine was given at a younger age. ? ?Most recent depression screenings: ?PHQ 2/9 Scores 12/14/2021  ?PHQ - 2 Score 0  ? ?HPI  ?No problem-specific Assessment & Plan notes found for this encounter. ? ?Past Medical History:  ?Diagnosis Date  ? Asthma   ? Low-lying placenta 08/18/2020  ? Resolved based on [place] 1/4 - placenta anterior right   ? Polydactyly, fetal, affecting care of mother, antepartum 08/08/2020  ? Post-dates pregnancy 11/06/2020  ? Rh negative state in antepartum period 08/18/2020  ? Supervision of normal pregnancy, antepartum 05/04/2020  ?  Nursing Staff Provider Office [place]  [place]  LMP Language  ENGLISH  Anatomy [place] bilateral polydactyly Previa> 1cm from os on 11/10 Flu Vaccine  DECLINED   Genetic Screen  NIPS: Low risk, female    AFP:   Not done  TDaP vaccine  08/04/20 Hgb A1C or  GTT Early  Third trimester    Ref. Range 08/04/2020 10:21 Glucose, 1 hour Latest Ref Range: 65 - 179 mg/dL 85 Glucose, Fasting Latest Ref Range  ? ?Past Surgical History:  ?Procedure Laterality Date  ? CESAREAN SECTION N/A 11/06/2020  ? Procedure: CESAREAN SECTION;  Surgeon: EChancy Milroy MD;  Location: [facility];  Service: Obstetrics;  Laterality: N/A;  ? ?Social History  ? ?Socioeconomic History  ? Marital  status: Single  ?  Spouse name: Not on file  ? Number of children: Not on file  ? Years of education: Not on file  ? Highest education level: Not on file  ?Occupational History  ? Not on file  ?Tobacco Use  ? Smoking status: Never  ? Smokeless tobacco: Never  ?Vaping Use  ? Vaping Use: Never used  ?Substance and Sexual Activity  ? Alcohol use: Not Currently  ? Drug use: Not Currently  ? Sexual activity: Yes  ?  Partners: Male  ?  Birth control/protection: Pill  ?Other Topics Concern  ? Not on file  ?Social History Narrative  ? Not on file  ? ?Social Determinants of Health  ? ?Financial Resource Strain: Not on file  ?Food Insecurity: Not on file  ?Transportation Needs: Not on file  ?Physical Activity: Not on file  ?Stress: Not on file  ?Social Connections: Not on file  ?Intimate Partner Violence: Not on file  ? ?Family Status  ?Relation Name Status  ? Mother  Alive  ? Father  Alive  ? Sister  Alive  ? Brother  Alive  ? [place] ? MGM  Deceased  ? MGF  Deceased  ? PGM  Deceased  ? PGF  Deceased  ? ?Family History  ?Problem Relation Age of Onset  ? Hypertension Mother   ? Diabetes Maternal Aunt   ?  Drug abuse Maternal Grandmother   ? ?Allergies  ?Allergen Reactions  ? Penicillins Rash  ?  Client was able to take Amoxicillin with no problem 04-2020  ?  ?Patient Care Team: ?Carlita Whitcomb, Charlene Brooke, NP as PCP - General (Internal Medicine) ?Mora Bellman, MD as Consulting Physician (Obstetrics and Gynecology)  ? ?Medications: ?Outpatient Medications Prior to Visit  ?Medication Sig  ? norethindrone (MICRONOR) 0.35 MG tablet Take 1 tablet (0.35 mg total) by mouth daily.  ? [DISCONTINUED] albuterol (VENTOLIN HFA) 108 (90 Base) MCG/ACT inhaler Inhale 2 puffs into the lungs every 6 (six) hours as needed for wheezing or shortness of breath. (Patient not taking: Reported on 12/03/2020)  ? [DISCONTINUED] Blood Pressure Monitor KIT 1 Device by Does not apply route once a week. To be monitored Regularly at home. (Patient not  taking: Reported on 12/03/2020)  ? [DISCONTINUED] coconut oil OIL Apply 1 application topically as needed. (Patient not taking: Reported on 12/03/2020)  ? [DISCONTINUED] Drospirenone (SLYND) 4 MG TABS Take 1 tablet by mouth daily. (Patient not taking: Reported on 12/14/2021)  ? [DISCONTINUED] ferrous sulfate 325 (65 FE) MG tablet Take 1 tablet (325 mg total) by mouth every other day. (Patient not taking: Reported on 12/03/2020)  ? [DISCONTINUED] ibuprofen (ADVIL) 600 MG tablet Take 1 tablet (600 mg total) by mouth every 6 (six) hours as needed. (Patient not taking: Reported on 12/03/2020)  ? [DISCONTINUED] oxyCODONE (ROXICODONE) 5 MG immediate release tablet Take 1 tablet (5 mg total) by mouth every 4 (four) hours as needed for severe pain. (Patient not taking: Reported on 12/03/2020)  ? [DISCONTINUED] Prenat-Fe Poly-Methfol-FA-DHA (VITAFOL ULTRA) 29-0.6-0.4-200 MG CAPS Take 1 capsule by mouth daily before breakfast. (Patient not taking: Reported on 12/03/2020)  ? ?No facility-administered medications prior to visit.  ? ?Review of Systems  ?Constitutional:  Negative for activity change, appetite change and fever.  ?HENT:  Negative for congestion and sore throat.   ?Eyes:   ?     Negative for visual changes  ?Respiratory:  Negative for cough and shortness of breath.   ?Cardiovascular:  Negative for chest pain, palpitations and leg swelling.  ?Gastrointestinal:  Negative for blood in stool, constipation and diarrhea.  ?Genitourinary:  Negative for dysuria, frequency and urgency.  ?Musculoskeletal:  Negative for myalgias.  ?Skin:  Negative for rash.  ?Neurological:  Negative for dizziness and headaches.  ?Hematological:  Does not bruise/bleed easily.  ?Psychiatric/Behavioral:  Negative for behavioral problems and suicidal ideas. The patient is not nervous/anxious.   ? ? ?   ?Objective:  ?BP 112/72 (BP Location: Left Arm, Patient Position: Sitting, Cuff Size: Large)   Pulse 69   Temp (!) 97 ?F (36.1 ?C) (Temporal)   Ht 5'  8.25" (1.734 m)   Wt 186 lb 12.8 oz (84.7 kg)   LMP 12/10/2021 (Exact Date)   SpO2 98%   BMI 28.20 kg/m?  ?  ? ? ? ?Physical Exam ?Constitutional:   ?   General: She is not in acute distress. ?HENT:  ?   Right Ear: Tympanic membrane, ear canal and external ear normal.  ?   Left Ear: Tympanic membrane, ear canal and external ear normal.  ?   Mouth/Throat:  ?   Pharynx: No oropharyngeal exudate.  ?Eyes:  ?   General: No scleral icterus. ?   Extraocular Movements: Extraocular movements intact.  ?   Conjunctiva/sclera: Conjunctivae normal.  ?Neck:  ?   Thyroid: No thyromegaly.  ?Cardiovascular:  ?   Rate and Rhythm: Normal rate  and regular rhythm.  ?   Pulses: Normal pulses.  ?   Heart sounds: Normal heart sounds.  ?Pulmonary:  ?   Effort: Pulmonary effort is normal.  ?   Breath sounds: Normal breath sounds.  ?Chest:  ?   Chest wall: No tenderness.  ?Abdominal:  ?   General: Bowel sounds are normal. There is no distension.  ?   Palpations: Abdomen is soft.  ?   Tenderness: There is no abdominal tenderness.  ?Genitourinary: ?   Comments: Deferred breast and pelvic exam to GYN ?Musculoskeletal:     ?   General: No tenderness. Normal range of motion.  ?   Cervical back: Normal range of motion and neck supple.  ?   Right lower leg: No edema.  ?   Left lower leg: No edema.  ?Lymphadenopathy:  ?   Cervical: No cervical adenopathy.  ?Skin: ?   General: Skin is warm and dry.  ?Neurological:  ?   Mental Status: She is alert and oriented to person, place, and time.  ?Psychiatric:     ?   Mood and Affect: Mood normal.     ?   Behavior: Behavior normal.     ?   Thought Content: Thought content normal.     ?   Judgment: Judgment normal.  ?  ?No results found for any visits on 12/14/21. ?   ?Assessment & Plan:  ?  ?Routine Health Maintenance and Physical Exam ? ?Immunization History  ?Administered Date(s) Administered  ? Influenza-Unspecified 08/26/2021  ? PFIZER(Purple Top)SARS-COV-2 Vaccination 01/25/2020, 02/15/2020,  08/27/2021  ? Tdap 08/04/2020  ? ? ?Health Maintenance  ?Topic Date Due  ? HPV VACCINES (1 - 2-dose series) Never done  ? COVID-19 Vaccine (4 - Booster for Pfizer series) 10/22/2021  ? PAP-Cervical Cytology Screening  08

## 2021-12-14 NOTE — Patient Instructions (Addendum)
Thank you for choosing [facility] ? ?Go to lab for blood draw ? ?If HPV vaccine was not administered at younger age, schedule nurse visit for start 2dose series of HPV vaccine ? ?You are due for repeat PAP in 2024 ?Perform monthly self breast exam. ? ?Preventive Care 39-25 Years Old, Female ?Preventive care refers to lifestyle choices and visits with your health care provider that can promote health and wellness. Preventive care visits are also called wellness exams. ?What can I expect for my preventive care visit? ?Counseling ?During your preventive care visit, your health care provider may ask about your: ?Medical history, including: ?Past medical problems. ?Family medical history. ?Pregnancy history. ?Current health, including: ?Menstrual cycle. ?Method of birth control. ?Emotional well-being. ?Home life and relationship well-being. ?Sexual activity and sexual health. ?Lifestyle, including: ?Alcohol, nicotine or tobacco, and drug use. ?Access to firearms. ?Diet, exercise, and sleep habits. ?Work and work Statistician. ?Sunscreen use. ?Safety issues such as seatbelt and bike helmet use. ?Physical exam ?Your health care provider may check your: ?Height and weight. These may be used to calculate your BMI (body mass index). BMI is a measurement that tells if you are at a healthy weight. ?Waist circumference. This measures the distance around your waistline. This measurement also tells if you are at a healthy weight and may help predict your risk of certain diseases, such as type 2 diabetes and high blood pressure. ?Heart rate and blood pressure. ?Body temperature. ?Skin for abnormal spots. ?What immunizations do I need? ?Vaccines are usually given at various ages, according to a schedule. Your health care provider will recommend vaccines for you based on your age, medical history, and lifestyle or other factors, such as travel or where you work. ?What tests do I need? ?Screening ?Your health care provider  may recommend screening tests for certain conditions. This may include: ?Pelvic exam and Pap test. ?Lipid and cholesterol levels. ?Diabetes screening. This is done by checking your blood sugar (glucose) after you have not eaten for a while (fasting). ?Hepatitis B test. ?Hepatitis C test. ?HIV (human immunodeficiency virus) test. ?STI (sexually transmitted infection) testing, if you are at risk. ?BRCA-related cancer screening. This may be done if you have a family history of breast, ovarian, tubal, or peritoneal cancers. ?Talk with your health care provider about your test results, treatment options, and if necessary, the need for more tests. ?Follow these instructions at home: ?Eating and drinking ? ?Eat a healthy diet that includes fresh fruits and vegetables, whole grains, lean protein, and low-fat dairy products. ?Take vitamin and mineral supplements as recommended by your health care provider. ?Do not drink alcohol if: ?Your health care provider tells you not to drink. ?You are pregnant, may be pregnant, or are planning to become pregnant. ?If you drink alcohol: ?Limit how much you have to 0-1 drink a day. ?Know how much alcohol is in your drink. In the [place] one drink equals one 12 oz bottle of beer (355 mL), one 5 oz glass of wine (148 mL), or one 1? oz glass of hard liquor (44 mL). ?Lifestyle ?Brush your teeth every morning and night with fluoride toothpaste. Floss one time each day. ?Exercise for at least 30 minutes 5 or more days each week. ?Do not use any products that contain nicotine or tobacco. These products include cigarettes, chewing tobacco, and vaping devices, such as e-cigarettes. If you need help quitting, ask your health care provider. ?Do not use drugs. ?If you are sexually active, practice safe sex. Use  a condom or other form of protection to prevent STIs. ?If you do not wish to become pregnant, use a form of birth control. If you plan to become pregnant, see your health care provider for a  prepregnancy visit. ?Find healthy ways to manage stress, such as: ?Meditation, yoga, or listening to music. ?Journaling. ?Talking to a trusted person. ?Spending time with friends and family. ?Minimize exposure to UV radiation to reduce your risk of skin cancer. ?Safety ?Always wear your seat belt while driving or riding in a vehicle. ?Do not drive: ?If you have been drinking alcohol. Do not ride with someone who has been drinking. ?If you have been using any mind-altering substances or drugs. ?While texting. ?When you are tired or distracted. ?Wear a helmet and other protective equipment during sports activities. ?If you have firearms in your house, make sure you follow all gun safety procedures. ?Seek help if you have been physically or sexually abused. ?What's next? ?Go to your health care provider once a year for an annual wellness visit. ?Ask your health care provider how often you should have your eyes and teeth checked. ?Stay up to date on all vaccines. ?This information is not intended to replace advice given to you by your health care provider. Make sure you discuss any questions you have with your health care provider. ?Document Revised: 03/10/2021 Document Reviewed: 03/10/2021 ?Elsevier Patient Education ? [place]. ? ?

## 2023-02-06 ENCOUNTER — Ambulatory Visit (INDEPENDENT_AMBULATORY_CARE_PROVIDER_SITE_OTHER): Payer: Commercial Managed Care - PPO | Admitting: Nurse Practitioner

## 2023-02-06 ENCOUNTER — Encounter: Payer: Self-pay | Admitting: Nurse Practitioner

## 2023-02-06 VITALS — BP 120/82 | HR 73 | Temp 98.3°F | Resp 16 | Ht 68.0 in | Wt 204.0 lb

## 2023-02-06 DIAGNOSIS — F4322 Adjustment disorder with anxiety: Secondary | ICD-10-CM

## 2023-02-06 DIAGNOSIS — G43009 Migraine without aura, not intractable, without status migrainosus: Secondary | ICD-10-CM | POA: Insufficient documentation

## 2023-02-06 LAB — BASIC METABOLIC PANEL
BUN: 14 mg/dL (ref 6–23)
CO2: 26 mEq/L (ref 19–32)
Calcium: 9.2 mg/dL (ref 8.4–10.5)
Chloride: 103 mEq/L (ref 96–112)
Creatinine, Ser: 0.85 mg/dL (ref 0.40–1.20)
GFR: 94.78 mL/min (ref >60.00)
Glucose, Bld: 73 mg/dL (ref 70–99)
Potassium: 4.2 mEq/L (ref 3.5–5.1)
Sodium: 137 mEq/L (ref 135–145)

## 2023-02-06 LAB — CBC WITH DIFFERENTIAL/PLATELET
Basophils Absolute: 0 10*3/uL (ref 0.0–0.1)
Basophils Relative: 0.4 % (ref 0.0–3.0)
Eosinophils Absolute: 0.2 10*3/uL (ref 0.0–0.7)
Eosinophils Relative: 4.5 % (ref 0.0–5.0)
HCT: 38.3 % (ref 36.0–46.0)
Hemoglobin: 12.8 g/dL (ref 12.0–15.0)
Lymphocytes Relative: 37.9 % (ref 12.0–46.0)
Lymphs Abs: 1.6 10*3/uL (ref 0.7–4.0)
MCHC: 33.5 g/dL (ref 30.0–36.0)
MCV: 88.2 fl (ref 78.0–100.0)
Monocytes Absolute: 0.5 10*3/uL (ref 0.1–1.0)
Monocytes Relative: 10.7 % (ref 3.0–12.0)
Neutro Abs: 2 10*3/uL (ref 1.4–7.7)
Neutrophils Relative %: 46.5 % (ref 43.0–77.0)
Platelets: 349 10*3/uL (ref 150.0–400.0)
RBC: 4.34 Mil/uL (ref 3.87–5.11)
RDW: 13.5 % (ref 11.5–15.5)
WBC: 4.2 10*3/uL (ref 4.0–10.5)

## 2023-02-06 LAB — C-REACTIVE PROTEIN: CRP: <1 mg/dL (ref 0.5–20.0)

## 2023-02-06 LAB — SEDIMENTATION RATE: Sed Rate: 26 mm/hr — ABNORMAL HIGH (ref 0–20)

## 2023-02-06 MED ORDER — RIZATRIPTAN BENZOATE 5 MG PO TABS: 5.0000 mg | ORAL_TABLET | ORAL | 0 refills | Status: DC | PRN

## 2023-02-06 MED ORDER — ONDANSETRON HCL 4 MG PO TABS: 4.0000 mg | ORAL_TABLET | Freq: Three times a day (TID) | ORAL | 0 refills | Status: DC | PRN

## 2023-02-06 NOTE — Progress Notes (Signed)
Established Patient Visit  Patient: Autumn Morgan   DOB: [birth year]   27 y.o. Female  MRN: [id number] Visit Date: 02/06/2023  Subjective:    Chief Complaint  Patient presents with   Headache    Pt c/o headaches at the back of head, sides and sometimes behind the eyes.  The headaches behind the eyes can be sharp at times.  Some phonophopia and nausea.  They started after her son was born they were waxing and waning, now they are becoming more frequent   Adjustment disorder with anxiety Onset 37yrs ago No SI/HI Agreed to psychology referral  Migraine without aura and without status migrainosus, not intractable Onset 5yrs ago, describes as tightness and arching on occipital, temporal region, and behind eyes, occurs randomly with no specific trigger, each episode last 1-2weeks, last episode was 3months ago, associated with nausea, dizziness, light and sound sensitivity. She occassionally wakes up with headache. Last eye exam within last 77yrs, use of corrective lens She denies any aura, head trauma, no Fhx of migraines, no chronic sinusitis, no insomnia, no tobacco/nicotine use She Sleep: 6-8hrs each night and feels rested She works from home on computer, not new, takes frequent breaks ETOH: socially use caffeine: coffee 1-2cups daily. Maintain 50-60oz of water intake daily. Current use of excedrin or tylenol with mild to moderate relief  Ordered head CT Use maxalt 5-10mg  and zofran 4mg  prn Check cbc, bmp, tsh  Reviewed medical, surgical, and social history today  Medications: Outpatient Medications Prior to Visit  Medication Sig   [DISCONTINUED] norethindrone (MICRONOR) 0.35 MG tablet Take 1 tablet (0.35 mg total) by mouth daily. (Patient not taking: Reported on 02/06/2023)   No facility-administered medications prior to visit.   Reviewed past medical and social history.   ROS per HPI above      Objective:  BP 120/82 (BP Location: Left Arm, Patient Position:  Sitting, Cuff Size: Large)   Pulse 73   Temp 98.3 F (36.8 C) (Temporal)   Resp 16   Ht 5\' 8"  (1.727 m)   LMP 01/28/2023 (Exact Date)   SpO2 99%   Breastfeeding No   BMI 28.40 kg/m      Physical Exam HENT:     Head: Normocephalic.     Right Ear: Hearing, tympanic membrane, ear canal and external ear normal.     Left Ear: Hearing, tympanic membrane, ear canal and external ear normal.     Nose: Nose normal.     Right Turbinates: Not enlarged or swollen.     Left Turbinates: Not enlarged or swollen.     Right Sinus: No maxillary sinus tenderness or frontal sinus tenderness.     Left Sinus: No maxillary sinus tenderness or frontal sinus tenderness.  Eyes:     Extraocular Movements: Extraocular movements intact.     Pupils: Pupils are equal, round, and reactive to light.  Cardiovascular:     Rate and Rhythm: Normal rate and regular rhythm.     Pulses: Normal pulses.     Heart sounds: Normal heart sounds.  Pulmonary:     Effort: Pulmonary effort is normal.     Breath sounds: Normal breath sounds.  Musculoskeletal:     Cervical back: Normal range of motion and neck supple.  Neurological:     Mental Status: She is alert and oriented to person, place, and time.     Cranial Nerves: No cranial nerve deficit.  Psychiatric:  Mood and Affect: Mood normal.        Speech: Speech normal.        Behavior: Behavior normal.     No results found for any visits on 02/06/23.    Assessment & Plan:    Problem List Items Addressed This Visit       Cardiovascular and Mediastinum   Migraine without aura and without status migrainosus, not intractable - Primary    Onset 92yrs ago, describes as tightness and arching on occipital, temporal region, and behind eyes, occurs randomly with no specific trigger, each episode last 1-2weeks, last episode was 3months ago, associated with nausea, dizziness, light and sound sensitivity. She occassionally wakes up with headache. Last eye exam within  last 52yrs, use of corrective lens She denies any aura, head trauma, no Fhx of migraines, no chronic sinusitis, no insomnia, no tobacco/nicotine use She Sleep: 6-8hrs each night and feels rested She works from home on computer, not new, takes frequent breaks ETOH: socially use caffeine: coffee 1-2cups daily. Maintain 50-60oz of water intake daily. Current use of excedrin or tylenol with mild to moderate relief  Ordered head CT Use maxalt 5-10mg  and zofran 4mg  prn Check cbc, bmp, tsh      Relevant Medications   rizatriptan (MAXALT) 5 MG tablet   ondansetron (ZOFRAN) 4 MG tablet   Other Relevant Orders   Basic metabolic panel   CBC with Differential/Platelet   Sedimentation rate   C-reactive protein   CT HEAD WO CONTRAST ( )     Other   Adjustment disorder with anxiety    Onset 33yrs ago No SI/HI Agreed to psychology referral      Relevant Orders   Ambulatory referral to Psychology   Return if symptoms worsen or fail to improve.     Alysia Penna, NP

## 2023-02-06 NOTE — Progress Notes (Signed)
Stable Follow instructions as discussed during office visit.

## 2023-02-06 NOTE — Assessment & Plan Note (Signed)
Onset 75yrs ago No SI/HI Agreed to psychology referral

## 2023-02-06 NOTE — Assessment & Plan Note (Addendum)
Onset 47yrs ago, describes as tightness and arching on occipital, temporal region, and behind eyes, occurs randomly with no specific trigger, each episode last 1-2weeks, last episode was 3months ago, associated with nausea, dizziness, light and sound sensitivity. She occassionally wakes up with headache. Last eye exam within last 1yrs, use of corrective lens She denies any aura, head trauma, no Fhx of migraines, no chronic sinusitis, no insomnia, no tobacco/nicotine use She Sleep: 6-8hrs each night and feels rested She works from home on computer, not new, takes frequent breaks ETOH: socially use caffeine: coffee 1-2cups daily. Maintain 50-60oz of water intake daily. Current use of excedrin or tylenol with mild to moderate relief  Ordered head CT Use maxalt 5-10mg  and zofran 4mg  prn Check cbc, bmp, tsh

## 2023-02-06 NOTE — Patient Instructions (Signed)
Go to lab  Migraine Headache A migraine headache is an intense pulsing or throbbing pain on one or both sides of the head. Migraine headaches may also cause other symptoms, such as nausea, vomiting, and sensitivity to light and noise. A migraine headache can last from 4 hours to 3 days. Talk with your health care provider about what things may bring on (trigger) your migraine headaches. What are the causes? The exact cause is not known. However, a migraine may be caused when nerves in the brain get irritated and release chemicals that cause blood vessels to become inflamed. This inflammation causes pain. Migraines may be triggered or caused by: Smoking. Medicines, such as: Nitroglycerin, which is used to treat chest pain. Birth control pills. Estrogen. Certain blood pressure medicines. Foods or drinks that contain nitrates, glutamate, aspartame, MSG, or tyramine. Certain foods or drinks, such as aged cheeses, chocolate, alcohol, or caffeine. Doing physical activity that is very hard. Other triggers may include: Menstruation. Pregnancy. Hunger. Stress. Getting too much or too little sleep. Weather changes. Tiredness (fatigue). What increases the risk? The following factors may make you more likely to have migraine headaches: Being between the ages of 70-42 years old. Being female. Having a family history of migraine headaches. Being Caucasian. Having a mental health condition, such as depression or anxiety. Being obese. What are the signs or symptoms? The main symptom of this condition is pulsing or throbbing pain. This pain may: Happen in any area of the head, such as on one or both sides. Make it hard to do daily activities. Get worse with physical activity. Get worse around bright lights, loud noises, or smells. Other symptoms may include: Nausea. Vomiting. Dizziness. Before a migraine headache starts, you may get warning signs (an aura). An aura may include: Seeing  flashing lights or having blind spots. Seeing bright spots, halos, or zigzag lines. Having tunnel vision or blurred vision. Having numbness or a tingling feeling. Having trouble talking. Having muscle weakness. After a migraine ends, you may have symptoms. These may include: Feeling tired. Trouble concentrating. How is this diagnosed? A migraine headache can be diagnosed based on: Your symptoms. A physical exam. Tests, such as: A CT scan or an MRI of the head. These tests can help rule out other causes of headaches. Taking fluid from the spine (lumbar puncture) to examine it (cerebrospinal fluid analysis, or CSF analysis). How is this treated? This condition may be treated with medicines that: Relieve pain and nausea. Prevent migraines. Treatment may also include: Acupuncture. Lifestyle changes like avoiding foods that trigger migraine headaches. Learning ways to control your body (biofeedback). Talk therapy to help you know and deal with negative thoughts (cognitive behavioral therapy). Follow these instructions at home: Medicines Take over-the-counter and prescription medicines only as told by your provider. Ask your provider if the medicine prescribed to you: Requires you to avoid driving or using machinery. Can cause constipation. You may need to take these actions to prevent or treat constipation: Drink enough fluid to keep your pee (urine) pale yellow. Take over-the-counter or prescription medicines. Eat foods that are high in fiber, such as beans, whole grains, and fresh fruits and vegetables. Limit foods that are high in fat and processed sugars, such as fried or sweet foods. Lifestyle  Do not drink alcohol. Do not use any products that contain nicotine or tobacco. These products include cigarettes, chewing tobacco, and vaping devices, such as e-cigarettes. If you need help quitting, ask your provider. Get 7-9 hours of sleep  each night, or the amount recommended by your  provider. Find ways to manage stress, such as meditation, deep breathing, or yoga. Try to exercise regularly. This can help lessen how bad and how often your migraines occur. General instructions Keep a journal to find out what triggers your migraines, so you can avoid those things. For example, write down: What you eat and drink. How much sleep you get. Any change to your diet or medicines. If you have a migraine headache: Avoid things that make your symptoms worse, such as bright lights. Lie down in a dark, quiet room. Do not drive or use machinery. Ask your provider what activities are safe for you while you have symptoms. Keep all follow-up visits. Your provider will monitor your symptoms and recommend any further treatment. Where to find more information Coalition for Headache and Migraine Patients (CHAMP): headachemigraine.org American Migraine Foundation: americanmigrainefoundation.org National Headache Foundation: headaches.org Contact a health care provider if: You have symptoms that are different or worse than your usual migraine headache symptoms. You have more than 15 days of headaches in one month. Get help right away if: Your migraine headache becomes severe or lasts more than 72 hours. You have a fever or stiff neck. You have vision loss. Your muscles feel weak or like you cannot control them. You lose your balance often or have trouble walking. You faint. You have a seizure. This information is not intended to replace advice given to you by your health care provider. Make sure you discuss any questions you have with your health care provider. Document Revised: 05/09/2022 Document Reviewed: 05/09/2022 Elsevier Patient Education  2023 [employer].

## 2023-02-13 ENCOUNTER — Encounter: Payer: Self-pay | Admitting: Nurse Practitioner

## 2023-02-14 ENCOUNTER — Ambulatory Visit
Admission: RE | Admit: 2023-02-14 | Discharge: 2023-02-14 | Disposition: A | Discharge status: Home / Self Care | Payer: Commercial Managed Care - PPO | Source: Ambulatory Visit | Attending: Nurse Practitioner | Admitting: Nurse Practitioner

## 2023-02-14 ENCOUNTER — Encounter: Payer: Self-pay | Admitting: Nurse Practitioner

## 2023-02-14 ENCOUNTER — Other Ambulatory Visit: Payer: Commercial Managed Care - PPO

## 2023-02-14 DIAGNOSIS — G43009 Migraine without aura, not intractable, without status migrainosus: Secondary | ICD-10-CM

## 2023-03-03 ENCOUNTER — Other Ambulatory Visit: Payer: Self-pay | Admitting: Nurse Practitioner

## 2023-03-03 DIAGNOSIS — G43009 Migraine without aura, not intractable, without status migrainosus: Secondary | ICD-10-CM

## 2023-03-07 MED ORDER — RIZATRIPTAN BENZOATE 5 MG PO TABS: 5.0000 mg | ORAL_TABLET | ORAL | 0 refills | Status: DC | PRN

## 2023-04-19 ENCOUNTER — Ambulatory Visit (HOSPITAL_COMMUNITY): Payer: Commercial Managed Care - PPO | Admitting: Student

## 2023-06-19 ENCOUNTER — Telehealth: Payer: Self-pay | Admitting: Nurse Practitioner

## 2023-06-19 ENCOUNTER — Ambulatory Visit: Payer: Commercial Managed Care - PPO | Admitting: Nurse Practitioner

## 2023-06-19 ENCOUNTER — Encounter: Payer: Self-pay | Admitting: Nurse Practitioner

## 2023-06-19 VITALS — BP 118/78 | HR 75 | Temp 97.9°F | Ht 68.0 in | Wt 205.0 lb

## 2023-06-19 DIAGNOSIS — G43009 Migraine without aura, not intractable, without status migrainosus: Secondary | ICD-10-CM | POA: Diagnosis not present

## 2023-06-19 DIAGNOSIS — Z124 Encounter for screening for malignant neoplasm of cervix: Secondary | ICD-10-CM | POA: Diagnosis not present

## 2023-06-19 MED ORDER — RIZATRIPTAN BENZOATE 10 MG PO TABS: 10.0000 mg | ORAL_TABLET | ORAL | 1 refills | Status: DC | PRN

## 2023-06-19 MED ORDER — RIZATRIPTAN BENZOATE 10 MG PO TABS: 10.0000 mg | ORAL_TABLET | ORAL | 0 refills | Status: AC | PRN

## 2023-06-19 NOTE — Patient Instructions (Signed)
Keep migraine/Headcahe journal to help identify triggers  Chronic Migraine Headache A migraine headache is throbbing pain that is usually on one side of the head. It can also cause other symptoms. A migraine is called a chronic migraine if it happens at least 15 days in a month for more than 3 months. Talk with your doctor about what things may bring on (trigger) your migraines. What are the causes? The exact cause of a migraine is not known. This condition may be brought on or caused by: Smoking. Some medicines, such as birth control or some blood pressure medicines. Certain substances in some foods or drinks. Foods and drinks, such as: Cheese. Chocolate. Alcohol. Caffeine. Other things that may bring on a migraine include: Periods. Stress. Getting too much or too little sleep. Feeling tired (fatigue). Bright lights or loud noises. Certain smells. Weather changes and being at high altitude. What increases the risk? The following factors may make you more likely to have chronic migraines: Having migraines or family members who have them. Having a mental health condition, such as being sad (depressed) or feeling worried or nervous (anxious). Taking a lot of pain medicine. Having sleep problems. Having heart disease, diabetes, or being very overweight (obese). What are the signs or symptoms? Symptoms vary for each person. The pain may: Feel like it is pulsing or throbbing. Happen only on one side of the head. In some cases, the pain may be on both sides of the head or around the head or neck. Be so bad that it keeps you from doing daily activities. Get worse with activity. Make you feel like you may vomit (feel nauseous) or vomit. Get worse around bright lights, loud noises, or smells. Cause you to feel dizzy. A sign that you may have chronic migraines is when you start to have more and more migraines. How is this treated? This condition is treated with medicines that: Lessen  pain and the feeling like you may vomit. Prevent migraines. Treatment may also include: Acupuncture. Changes to your diet or sleep. Relaxation training. Learning ways to control your body and breathing (biofeedback). Talk therapy to help you know and deal with negative thoughts (cognitive behavioral therapy). Using a device that gives electrical stimulation to your nerves, which can help take away pain. A shot of medicine into the muscles of the face or head. Surgery, if the other treatments do not work. Follow these instructions at home: Medicines Take over-the-counter and prescription medicines only as told by your doctor. Ask your doctor if the medicine prescribed to you requires you to avoid driving or using machinery. Lifestyle  Do not drink alcohol. Do not smoke or use any products that contain nicotine or tobacco. If you need help quitting, ask your doctor. Get 7-9 hours of sleep every night, or the amount of sleep recommended by your doctor. Lower the stress in your life. Ask your doctor about ways to do this. Stay at a healthy weight. Talk with your doctor if you need help losing weight. Get regular exercise. General instructions Keep a journal to find out if certain things bring on migraines. This can help you avoid those things. For example, write down: What you eat and drink. How much sleep you get. Any change to your diet or medicines. Lie down in a dark, quiet room when you have a migraine. Try placing a cool towel over your head when you have a migraine. Keep lights dim if bright lights bother you or make your migraines worse. Where  to find more information Coalition for Headache and Migraine Patients (CHAMP): headachemigraine.org American Migraine Foundation: americanmigrainefoundation.org National Headache Foundation: headaches.org Contact a doctor if: Medicine does not help your migraine. Your pain keeps coming back even with medicine. Get help right away  if: Your migraine becomes really bad and medicine does not help. You have a fever or stiff neck. You have trouble seeing. Your muscles are weak or you lose control of them. You lose your balance or have trouble walking. You feel like you may faint or you faint. You start having sudden, very bad headaches. You have a seizure. This information is not intended to replace advice given to you by your health care provider. Make sure you discuss any questions you have with your health care provider. Document Revised: 05/09/2022 Document Reviewed: 05/09/2022 Elsevier Patient Education  2024 [employer].

## 2023-06-19 NOTE — Assessment & Plan Note (Signed)
No new migraine/headache features Reports 1headache per week, resolves with use of tylenol Reports every other month, last 2days, resolves with use of maxalt 12tabs per day. Unknown trigger. Normal head CT 01/2023  Increased maxalt dose to 10mg . New rx sent Advised to keep migraine journal to help identify trigger and frequency.

## 2023-06-19 NOTE — Progress Notes (Signed)
Established Patient Visit  Patient: Autumn Morgan   DOB: Jan 24, 1996   27 y.o. Female  MRN: [id number] Visit Date: 06/19/2023  Subjective:    Chief Complaint  Patient presents with   Migraine    Patient states having a really bad migraine took tylenol today and said it eases the pain but its still there. Patient want to know if the previous medication that was prescribe Rizatriptan can be refilled or even try a higher dose. Pt states no nausa     Migraine without aura and without status migrainosus, not intractable No new migraine/headache features Reports 1headache per week, resolves with use of tylenol Reports every other month, last 2days, resolves with use of maxalt 12tabs per day. Unknown trigger. Normal head CT 01/2023  Increased maxalt dose to 10mg . New rx sent Advised to keep migraine journal to help identify trigger and frequency.  Reviewed medical, surgical, and social history today  Medications: Outpatient Medications Prior to Visit  Medication Sig   ondansetron (ZOFRAN) 4 MG tablet Take 1 tablet (4 mg total) by mouth every 8 (eight) hours as needed for nausea or vomiting.   [DISCONTINUED] rizatriptan (MAXALT) 5 MG tablet Take 1 tablet (5 mg total) by mouth as needed for migraine. May repeat in 2 hours if needed. No more than 30mg  in 24hrs. Needs appointment for additional refills (Patient not taking: Reported on 06/19/2023)   No facility-administered medications prior to visit.   Reviewed past medical and social history.   ROS per HPI above      Objective:  BP 118/78 (BP Location: Left Arm, Patient Position: Sitting, Cuff Size: Normal)   Pulse 75   Temp 97.9 F (36.6 C) (Temporal)   Ht 5\' 8"  (1.727 m)   Wt 205 lb (93 kg)   LMP 06/01/2023 (Exact Date)   SpO2 98%   BMI 31.17 kg/m      Physical Exam Cardiovascular:     Rate and Rhythm: Normal rate.     Pulses: Normal pulses.  Pulmonary:     Effort: Pulmonary effort is normal.   Neurological:     Mental Status: She is alert and oriented to person, place, and time.     No results found for any visits on 06/19/23.    Assessment & Plan:    Problem List Items Addressed This Visit     Migraine without aura and without status migrainosus, not intractable - Primary    No new migraine/headache features Reports 1headache per week, resolves with use of tylenol Reports every other month, last 2days, resolves with use of maxalt 12tabs per day. Unknown trigger. Normal head CT 01/2023  Increased maxalt dose to 10mg . New rx sent Advised to keep migraine journal to help identify trigger and frequency.      Relevant Medications   rizatriptan (MAXALT) 10 MG tablet   Other Visit Diagnoses     Encounter for Papanicolaou smear for cervical cancer screening       Relevant Orders   Ambulatory referral to Gynecology      Return in about 3 months (around 09/18/2023) for CPE (fasting).     Alysia Penna, NP

## 2023-06-19 NOTE — Telephone Encounter (Signed)
Pt states pharmacy told her RX for Maxalt needs to be changed to dispense #10 for 30 day supply in order for insurance to cover.

## 2023-06-19 NOTE — Telephone Encounter (Signed)
Left patient a detailed voice message with annotation below and to return call to office with any concerns

## 2023-06-24 LAB — AMB RESULTS CONSOLE CBG: Glucose: 106

## 2023-07-05 NOTE — Progress Notes (Signed)
Patient attended screening event 06/24/2023. Patient blood glucose 106 and blood pressure 121/81. Patient does not have any SDOH needs at this time.

## 2023-08-14 ENCOUNTER — Encounter: Payer: Self-pay | Admitting: *Deleted

## 2023-08-14 NOTE — Progress Notes (Signed)
Pt attended 06/24/23 screening event where her b/p was 121/81 and her blood sugar was 106. At the event, the pt confirmed her PCP as Alysia Penna, NP at the [facility] at [facility], and did not identify any SDOH. Chart review indicates pt was last seen by her PCP on 06/19/23 and has a future appt with NP Nche on 09/25/23. No additional health equity team support indicated at this time.

## 2023-08-21 ENCOUNTER — Encounter: Payer: Self-pay | Admitting: Nurse Practitioner

## 2023-09-25 ENCOUNTER — Encounter: Payer: Self-pay | Admitting: Nurse Practitioner

## 2023-09-25 ENCOUNTER — Other Ambulatory Visit (HOSPITAL_COMMUNITY)
Admission: RE | Admit: 2023-09-25 | Discharge: 2023-09-25 | Disposition: A | Discharge status: Home / Self Care | Payer: Commercial Managed Care - PPO | Source: Ambulatory Visit | Attending: Nurse Practitioner | Admitting: Nurse Practitioner

## 2023-09-25 ENCOUNTER — Ambulatory Visit (INDEPENDENT_AMBULATORY_CARE_PROVIDER_SITE_OTHER): Payer: Commercial Managed Care - PPO | Admitting: Nurse Practitioner

## 2023-09-25 VITALS — BP 113/78 | HR 77 | Temp 98.1°F | Resp 18 | Ht 68.0 in | Wt 205.4 lb

## 2023-09-25 DIAGNOSIS — Z30016 Encounter for initial prescription of transdermal patch hormonal contraceptive device: Secondary | ICD-10-CM | POA: Insufficient documentation

## 2023-09-25 DIAGNOSIS — Z1159 Encounter for screening for other viral diseases: Secondary | ICD-10-CM

## 2023-09-25 DIAGNOSIS — Z0001 Encounter for general adult medical examination with abnormal findings: Secondary | ICD-10-CM

## 2023-09-25 LAB — COMPREHENSIVE METABOLIC PANEL
ALT: 8 U/L (ref 0–35)
AST: 15 U/L (ref 0–37)
Albumin: 4 g/dL (ref 3.5–5.2)
Alkaline Phosphatase: 73 U/L (ref 39–117)
BUN: 10 mg/dL (ref 6–23)
CO2: 27 meq/L (ref 19–32)
Calcium: 9 mg/dL (ref 8.4–10.5)
Chloride: 103 meq/L (ref 96–112)
Creatinine, Ser: 0.9 mg/dL (ref 0.40–1.20)
GFR: 88.1 mL/min (ref >60.00)
Glucose, Bld: 83 mg/dL (ref 70–99)
Potassium: 3.7 meq/L (ref 3.5–5.1)
Sodium: 138 meq/L (ref 135–145)
Total Bilirubin: 0.3 mg/dL (ref 0.2–1.2)
Total Protein: 7.2 g/dL (ref 6.0–8.3)

## 2023-09-25 LAB — CBC
HCT: 36.8 % (ref 36.0–46.0)
Hemoglobin: 12.1 g/dL (ref 12.0–15.0)
MCHC: 32.8 g/dL (ref 30.0–36.0)
MCV: 89.1 fL (ref 78.0–100.0)
Platelets: 306 10*3/uL (ref 150.0–400.0)
RBC: 4.13 Mil/uL (ref 3.87–5.11)
RDW: 13.5 % (ref 11.5–15.5)
WBC: 4.5 10*3/uL (ref 4.0–10.5)

## 2023-09-25 LAB — POCT URINE PREGNANCY: Preg Test, Ur: NEGATIVE

## 2023-09-25 LAB — URINE CYTOLOGY ANCILLARY ONLY
Chlamydia: NEGATIVE
Comment: NEGATIVE
Comment: NEGATIVE
Comment: NORMAL
Neisseria Gonorrhea: NEGATIVE
Trichomonas: NEGATIVE

## 2023-09-25 LAB — TSH: TSH: 1.43 u[IU]/mL (ref 0.35–5.50)

## 2023-09-25 MED ORDER — NORELGESTROMIN-ETH ESTRADIOL 150-35 MCG/24HR TD PTWK: 1.0000 | MEDICATED_PATCH | TRANSDERMAL | 3 refills | Status: AC

## 2023-09-25 NOTE — Patient Instructions (Signed)
Go to lab for blood draw and urine colleciton Maintain Heart healthy diet and daily exercise. Have PAP smear result faxed to me Will send contraception patch after urine result  Preventive Care 60-26 Years Old, Female Preventive care refers to lifestyle choices and visits with your health care provider that can promote health and wellness. Preventive care visits are also called wellness exams. What can I expect for my preventive care visit? Counseling During your preventive care visit, your health care provider may ask about your: Medical history, including: Past medical problems. Family medical history. Pregnancy history. Current health, including: Menstrual cycle. Method of birth control. Emotional well-being. Home life and relationship well-being. Sexual activity and sexual health. Lifestyle, including: Alcohol, nicotine or tobacco, and drug use. Access to firearms. Diet, exercise, and sleep habits. Work and work Astronomer. Sunscreen use. Safety issues such as seatbelt and bike helmet use. Physical exam Your health care provider may check your: Height and weight. These may be used to calculate your BMI (body mass index). BMI is a measurement that tells if you are at a healthy weight. Waist circumference. This measures the distance around your waistline. This measurement also tells if you are at a healthy weight and may help predict your risk of certain diseases, such as type 2 diabetes and high blood pressure. Heart rate and blood pressure. Body temperature. Skin for abnormal spots. What immunizations do I need?  Vaccines are usually given at various ages, according to a schedule. Your health care provider will recommend vaccines for you based on your age, medical history, and lifestyle or other factors, such as travel or where you work. What tests do I need? Screening Your health care provider may recommend screening tests for certain conditions. This may include: Pelvic  exam and Pap test. Lipid and cholesterol levels. Diabetes screening. This is done by checking your blood sugar (glucose) after you have not eaten for a while (fasting). Hepatitis B test. Hepatitis C test. HIV (human immunodeficiency virus) test. STI (sexually transmitted infection) testing, if you are at risk. BRCA-related cancer screening. This may be done if you have a family history of breast, ovarian, tubal, or peritoneal cancers. Talk with your health care provider about your test results, treatment options, and if necessary, the need for more tests. Follow these instructions at home: Eating and drinking  Eat a healthy diet that includes fresh fruits and vegetables, whole grains, lean protein, and low-fat dairy products. Take vitamin and mineral supplements as recommended by your health care provider. Do not drink alcohol if: Your health care provider tells you not to drink. You are pregnant, may be pregnant, or are planning to become pregnant. If you drink alcohol: Limit how much you have to 0-1 drink a day. Know how much alcohol is in your drink. In the [place] one drink equals one 12 oz bottle of beer (355 mL), one 5 oz glass of wine (148 mL), or one 1 oz glass of hard liquor (44 mL). Lifestyle Brush your teeth every morning and night with fluoride toothpaste. Floss one time each day. Exercise for at least 30 minutes 5 or more days each week. Do not use any products that contain nicotine or tobacco. These products include cigarettes, chewing tobacco, and vaping devices, such as e-cigarettes. If you need help quitting, ask your health care provider. Do not use drugs. If you are sexually active, practice safe sex. Use a condom or other form of protection to prevent STIs. If you do not wish to become  pregnant, use a form of birth control. If you plan to become pregnant, see your health care provider for a prepregnancy visit. Find healthy ways to manage stress, such as: Meditation,  yoga, or listening to music. Journaling. Talking to a trusted person. Spending time with friends and family. Minimize exposure to UV radiation to reduce your risk of skin cancer. Safety Always wear your seat belt while driving or riding in a vehicle. Do not drive: If you have been drinking alcohol. Do not ride with someone who has been drinking. If you have been using any mind-altering substances or drugs. While texting. When you are tired or distracted. Wear a helmet and other protective equipment during sports activities. If you have firearms in your house, make sure you follow all gun safety procedures. Seek help if you have been physically or sexually abused. What's next? Go to your health care provider once a year for an annual wellness visit. Ask your health care provider how often you should have your eyes and teeth checked. Stay up to date on all vaccines. This information is not intended to replace advice given to you by your health care provider. Make sure you discuss any questions you have with your health care provider. Document Revised: 03/10/2021 Document Reviewed: 03/10/2021 Elsevier Patient Education  2024 [employer].

## 2023-09-25 NOTE — Assessment & Plan Note (Signed)
Request to resume a hormonal contraception. Previous use of depo provera-does not want to use this again due to weight gain. She has upcoming appointment with GYN for pelvic exam No Fhx of blood clots. No Fhx of breast or ovarian or uterine cancer. LMP 09/08/23 Urine pregnancy today: negative We discussed use of COC vs transdermal patch vs vaginal ring vs implant vs IUD. She opted to start a transdermal contraception

## 2023-09-25 NOTE — Progress Notes (Signed)
Complete physical exam  Patient: Autumn Morgan   DOB: 24-May-1996   27 y.o. Female  MRN: [id number] Visit Date: 09/25/2023  Subjective:    Chief Complaint  Patient presents with   Annual Exam    She is due for HPV vaccine and cervical screening appointment scheduled for February; she is unsure of location.    Autumn Morgan is a 27 y.o. female who presents today for a complete physical exam. She reports consuming a general diet.  Occassional walking  She generally feels well. She reports sleeping well. She does have additional problems to discuss today.  Vision:Yes Dental:Yes STD Screen:Yes  BP Readings from Last 3 Encounters:  09/25/23 113/78  06/24/23 121/81  06/19/23 118/78   Wt Readings from Last 3 Encounters:  09/25/23 205 lb 6.4 oz (93.2 kg)  06/19/23 205 lb (93 kg)  02/06/23 204 lb (92.5 kg)   Most recent fall risk assessment:    09/25/2023    9:22 AM  Fall Risk   Falls in the past year? 0  Number falls in past yr: 0  Injury with Fall? 0  Risk for fall due to : No Fall Risks  Follow up Falls evaluation completed   Depression screen:Yes - No Depression Most recent depression screenings:    09/25/2023    9:22 AM 06/19/2023    9:37 AM  PHQ 2/9 Scores  PHQ - 2 Score 0 1  PHQ- 9 Score 0    HPI  Encounter for initial prescription of transdermal patch hormonal contraceptive device Request to resume a hormonal contraception. Previous use of depo provera-does not want to use this again due to weight gain. She has upcoming appointment with GYN for pelvic exam No Fhx of blood clots. No Fhx of breast or ovarian or uterine cancer. LMP 09/08/23 Urine pregnancy today: negative We discussed use of COC vs transdermal patch vs vaginal ring vs implant vs IUD. 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   In 2011   Low-lying placenta 08/18/2020   Resolved based on [place] on 1/4 - placenta anterior right    Polydactyly, fetal, affecting care of mother, antepartum 08/08/2020   Post-dates pregnancy 11/06/2020   Rh negative state in antepartum period 08/18/2020   Supervision of normal pregnancy, antepartum 05/04/2020    Nursing Staff Provider Office Location  [facility]  Dating  LMP Language  ENGLISH  Anatomy US  bilateral polydactyly Previa> 1cm from os on 11/10 Flu Vaccine  DECLINED   Genetic Screen  NIPS: Low risk, female    AFP:   Not done  TDaP vaccine  08/04/20 Hgb A1C or  GTT Early  Third trimester    Ref. Range 08/04/2020 10:21 Glucose, 1 hour Latest Ref Range: 65 - 179 mg/dL 85 Glucose, Fasting Latest Ref Range   Past Surgical History:  Procedure Laterality Date   CESAREAN SECTION N/A 11/06/2020   Procedure: CESAREAN SECTION;  Surgeon: Hermina Staggers, MD;  Location: [facility];  Service: Obstetrics;  Laterality: N/A;   Social History   Socioeconomic History   Marital status: Single    Spouse name: Not on file   Number of children: Not on file   Years of education: Not on file   Highest education level: Not on file  Occupational History   Not  on file  Tobacco Use   Smoking status: Never   Smokeless tobacco: Never  Vaping Use   Vaping status: Never Used  Substance and Sexual Activity   Alcohol use: Not Currently   Drug use: Not Currently   Sexual activity: Yes    Partners: Male    Birth control/protection: Pill  Other Topics Concern   Not on file  Social History Narrative   Not on file   Social Drivers of Health   Financial Resource Strain: Not on file  Food Insecurity: No Food Insecurity (06/24/2023)   Hunger Vital Sign    Worried About Running Out of Food in the Last Year: Never true    Ran Out of Food in the Last Year: Never true  Transportation Needs: No Transportation Needs (06/24/2023)   PRAPARE - Scientist, research (physical sciences) (Medical): No    Lack of Transportation (Non-Medical): No  Physical Activity: Not on file  Stress: Not on file  Social Connections: Unknown (01/23/2022)   [facility] from [facility]   Social Network    Social Network: Not on file  Intimate Partner Violence: Not At Risk (06/24/2023)   Humiliation, Afraid, Rape, and Kick questionnaire    Fear of Current or Ex-Partner: No    Emotionally Abused: No    Physically Abused: No    Sexually Abused: No   Family Status  Relation Name Status   Mother  Alive   Father  Alive   Sister  Alive   Brother  Alive   Mat Aunt  Alive   MGM  Deceased   MGF  Deceased   PGM  Deceased   PGF  Deceased  No partnership data on file   Family History  Problem Relation Age of Onset   Hypertension Mother    Diabetes Maternal Aunt    Drug abuse Maternal Grandmother    Allergies  Allergen Reactions   Penicillins Rash    Client was able to take Amoxicillin with no problem 04-2020    Patient Care Team: Avital Dancy, Bonna Gains, NP as PCP - General (Internal Medicine) Constant, Peggy, MD as Consulting Physician (Obstetrics and Gynecology)   Medications: Outpatient Medications Prior to Visit  Medication Sig   ondansetron (ZOFRAN) 4 MG tablet Take 1 tablet (4 mg total) by mouth every 8 (eight) hours as needed for nausea or vomiting.   rizatriptan (MAXALT) 10 MG tablet Take 1 tablet (10 mg total) by mouth as needed for migraine. May repeat in 2 hours if needed. No more than 30mg  in 24hrs.   No facility-administered medications prior to visit.    Review of Systems  Constitutional:  Negative for activity change, appetite change and unexpected weight change.  Respiratory: Negative.    Cardiovascular: Negative.   Gastrointestinal: Negative.   Endocrine: Negative for cold intolerance and heat intolerance.  Genitourinary: Negative.   Musculoskeletal: Negative.   Skin: Negative.   Neurological: Negative.   Hematological: Negative.    Psychiatric/Behavioral:  Negative for behavioral problems, decreased concentration, dysphoric mood, hallucinations, self-injury, sleep disturbance and suicidal ideas. The patient is not nervous/anxious.         Objective:  BP 113/78 (BP Location: Left Arm, Patient Position: Sitting, Cuff Size: Large)   Pulse 77   Temp 98.1 F (36.7 C) (Temporal)   Resp 18   Ht 5\' 8"  (1.727 m)   Wt 205 lb 6.4 oz (93.2 kg)   LMP 09/08/2023 (Exact Date)   SpO2 99%  BMI 31.23 kg/m     Physical Exam Vitals and nursing note reviewed.  Constitutional:      General: She is not in acute distress. HENT:     Right Ear: Tympanic membrane, ear canal and external ear normal.     Left Ear: Tympanic membrane, ear canal and external ear normal.     Nose: Nose normal.  Eyes:     Extraocular Movements: Extraocular movements intact.     Conjunctiva/sclera: Conjunctivae normal.     Pupils: Pupils are equal, round, and reactive to light.  Neck:     Thyroid: No thyroid mass, thyromegaly or thyroid tenderness.  Cardiovascular:     Rate and Rhythm: Normal rate and regular rhythm.     Pulses: Normal pulses.     Heart sounds: Normal heart sounds.  Pulmonary:     Effort: Pulmonary effort is normal.     Breath sounds: Normal breath sounds.  Abdominal:     General: Bowel sounds are normal.     Palpations: Abdomen is soft.  Musculoskeletal:        General: Normal range of motion.     Cervical back: Normal range of motion and neck supple.     Right lower leg: No edema.     Left lower leg: No edema.  Lymphadenopathy:     Cervical: No cervical adenopathy.  Skin:    General: Skin is warm and dry.  Neurological:     Mental Status: She is alert and oriented to person, place, and time.     Cranial Nerves: No cranial nerve deficit.  Psychiatric:        Mood and Affect: Mood normal.        Behavior: Behavior normal.        Thought Content: Thought content normal.      Results for orders placed or performed  in visit on 09/25/23  POCT urine pregnancy  Result Value Ref Range   Preg Test, Ur Negative Negative      Assessment & Plan:    Routine Health Maintenance and Physical Exam  Immunization History  Administered Date(s) Administered   Influenza Inj Mdck Quad Pf 06/21/2018   Influenza-Unspecified 08/26/2021   PFIZER(Purple Top)SARS-COV-2 Vaccination 01/25/2020, 02/15/2020, 08/27/2021   Tdap 08/04/2020    Health Maintenance  Topic Date Due   Cervical Cancer Screening (Pap smear)  05/12/2023   INFLUENZA VACCINE  12/25/2023 (Originally 04/27/2023)   COVID-19 Vaccine (4 - 2024-25 season) 12/25/2023 (Originally 05/28/2023)   HPV VACCINES (1 - 3-dose series) 09/24/2024 (Originally 01/05/2012)   DTaP/Tdap/Td (2 - Td or Tdap) 08/04/2030   Hepatitis C Screening  Completed   HIV Screening  Completed    Discussed health benefits of physical activity, and encouraged her to engage in regular exercise appropriate for her age and condition.  Problem List Items Addressed This Visit     Encounter for initial prescription of transdermal patch hormonal contraceptive device   Request to resume a hormonal contraception. Previous use of depo provera-does not want to use this again due to weight gain. She has upcoming appointment with GYN for pelvic exam No Fhx of blood clots. No Fhx of breast or ovarian or uterine cancer. LMP 09/08/23 Urine pregnancy today: negative We discussed use of COC vs transdermal patch vs vaginal ring vs implant vs IUD. She opted to start a transdermal contraception      Relevant Medications   norelgestromin-ethinyl estradiol [employer]) 150-35 MCG/24HR transdermal patch   Other Relevant Orders   POCT  urine pregnancy (Completed)   Other Visit Diagnoses       Encounter for preventative adult health care exam with abnormal findings    -  Primary   Relevant Orders   CBC   Comprehensive metabolic panel   TSH     Encounter for screening for viral disease       Relevant  Orders   Hepatitis C antibody   HIV Antibody (routine testing w rflx)   RPR   Urine cytology ancillary only   Hep B Core Ab W/Reflex      Return in about 1 year (around 09/24/2024) for CPE (fasting).     Alysia Penna, NP

## 2023-09-26 LAB — RPR: RPR Ser Ql: NONREACTIVE

## 2023-09-26 LAB — HEPATITIS B CORE AB W/REFLEX: Hep B Core Total Ab: NEGATIVE

## 2023-09-26 LAB — HEPATITIS C ANTIBODY: Hepatitis C Ab: NONREACTIVE

## 2023-09-26 LAB — HIV ANTIBODY (ROUTINE TESTING W REFLEX): HIV 1&2 Ab, 4th Generation: NONREACTIVE

## 2024-01-28 ENCOUNTER — Encounter (INDEPENDENT_AMBULATORY_CARE_PROVIDER_SITE_OTHER): Payer: Self-pay | Admitting: Nurse Practitioner

## 2024-01-28 DIAGNOSIS — L239 Allergic contact dermatitis, unspecified cause: Secondary | ICD-10-CM

## 2024-01-29 DIAGNOSIS — L239 Allergic contact dermatitis, unspecified cause: Secondary | ICD-10-CM | POA: Diagnosis not present

## 2024-01-29 MED ORDER — PREDNISONE 10 MG (21) PO TBPK: ORAL_TABLET | ORAL | 0 refills | Status: DC

## 2024-01-29 NOTE — Addendum Note (Signed)
 Addended by: Kathrene Parents L on: 01/29/2024 12:32 PM   Modules accepted: Orders

## 2024-01-29 NOTE — Telephone Encounter (Signed)
Please see the MyChart message reply(ies) for my assessment and plan.  The patient gave consent for this Medical Advice Message and is aware that it may result in a bill to their insurance company as well as the possibility that this may result in a co-payment or deductible. They are an established patient, but are not seeking medical advice exclusively about a problem treated during an in person or video visit in the last 7 days. I did not recommend an in person or video visit within 7 days of my reply.  I spent a total of 10 minutes cumulative time within 7 days through MyChart messaging Lillith Mcneff, NP  

## 2024-03-07 NOTE — Telephone Encounter (Signed)
 Copied from CRM [id number]. Topic: General - Other >> Mar 06, 2024  4:56 PM Luane Rumps D wrote: Reason for CRM: Ricky Charter, CMA, patient called back regarding message that she needs to schedule an appt - would like more detail on why if - she could get a call.

## 2024-04-29 ENCOUNTER — Encounter: Payer: Self-pay | Admitting: Emergency Medicine

## 2024-04-29 ENCOUNTER — Encounter: Payer: Self-pay | Admitting: Nurse Practitioner

## 2024-04-29 ENCOUNTER — Ambulatory Visit (INDEPENDENT_AMBULATORY_CARE_PROVIDER_SITE_OTHER): Admitting: Emergency Medicine

## 2024-04-29 VITALS — BP 122/72 | HR 71 | Temp 98.2°F | Resp 16 | Ht 68.0 in | Wt 198.0 lb

## 2024-04-29 DIAGNOSIS — R0981 Nasal congestion: Secondary | ICD-10-CM | POA: Diagnosis not present

## 2024-04-29 DIAGNOSIS — Z9109 Other allergy status, other than to drugs and biological substances: Secondary | ICD-10-CM | POA: Diagnosis not present

## 2024-04-29 DIAGNOSIS — J0110 Acute frontal sinusitis, unspecified: Secondary | ICD-10-CM | POA: Diagnosis not present

## 2024-04-29 LAB — POC COVID19 BINAXNOW: SARS Coronavirus 2 Ag: NEGATIVE

## 2024-04-29 MED ORDER — FLUTICASONE PROPIONATE 50 MCG/ACT NA SUSP: 1.0000 | Freq: Every day | NASAL | 0 refills | Status: AC

## 2024-04-29 MED ORDER — CETIRIZINE HCL 10 MG PO TABS: 10.0000 mg | ORAL_TABLET | Freq: Every day | ORAL | 0 refills | Status: AC

## 2024-04-29 MED ORDER — AMOXICILLIN 875 MG PO TABS: 875.0000 mg | ORAL_TABLET | Freq: Two times a day (BID) | ORAL | 0 refills | Status: AC

## 2024-04-29 MED ORDER — KETOROLAC TROMETHAMINE 60 MG/2ML IM SOLN
30.0000 mg | Freq: Once | INTRAMUSCULAR | Status: AC
  Administered 2024-04-29: 30 mg via INTRAMUSCULAR

## 2024-04-29 NOTE — Patient Instructions (Signed)
 Diagnoses today: 1. Acute non-recurrent frontal sinusitis (Primary) An injection for pain was given in clinic.  Antibiotics have been prescribed The sinus medication you mentioned is an herbal supplement that is safe to take if you find it helpful For lots of pressure- I prefer something like generic advil  sinus (this is ibuprofen  plus pseudoephedrine) to help with mucous and pressure.  - ketorolac  (TORADOL ) injection 30 mg - amoxicillin  (AMOXIL ) 875 MG tablet; Take 1 tablet (875 mg total) by mouth 2 (two) times daily for 7 days.  Dispense: 14 tablet; Refill: 0  2. Environmental allergies Restart zyrtec  Add nasal steroid. This has been sent as a prescription, but just buy the OTC generic if it is not covered by insurance.  Let me know if we need to step up to include a steroid pack by mouth  3. Nasal congestion COVID was negative. Symptoms should improve soon with the above recommendations - POC COVID-19 BinaxNow

## 2024-04-29 NOTE — Telephone Encounter (Signed)
 Called patient to ask how she is feeling. She stated that she is feeing okay that she took a Tylenol  this morning received some relief  still having the pressure in head and nose. Stated that she would like to be seen if possible. Patient is scheduled to see Corean Posey, PA-C today at 1:40 PM. She thanked me for calling.

## 2024-04-29 NOTE — Progress Notes (Signed)
 Assessment & Plan:   Patient Instructions  Diagnoses today: 1. Acute non-recurrent frontal sinusitis (Primary) An injection for pain was given in clinic.  Antibiotics have been prescribed The sinus medication you mentioned is an herbal supplement that is safe to take if you find it helpful For lots of pressure- I prefer something like generic advil  sinus (this is ibuprofen  plus pseudoephedrine) to help with mucous and pressure.  - ketorolac  (TORADOL ) injection 30 mg - amoxicillin  (AMOXIL ) 875 MG tablet; Take 1 tablet (875 mg total) by mouth 2 (two) times daily for 7 days.  Dispense: 14 tablet; Refill: 0  2. Environmental allergies Restart zyrtec  Add nasal steroid. This has been sent as a prescription, but just buy the OTC generic if it is not covered by insurance.  Let me know if we need to step up to include a steroid pack by mouth  3. Nasal congestion COVID was negative. Symptoms should improve soon with the above recommendations - POC COVID-19 BinaxNow   Results for orders placed or performed in visit on 04/29/24  POC COVID-19 BinaxNow  Result Value Ref Range   SARS Coronavirus 2 Ag Negative Negative    Follow up plan: No follow-ups on file.  Corean Geralds, MSPAS, PA-C   Subjective:  HPI: Autumn Morgan is a 28 y.o. female presenting on 04/29/2024 for Headache (Headache x 2 days ) and Nasal Congestion (Congestion x 2 days. Sneezing and runny nose. Denies coughing and body aches. Negative at home COVID test yesterday- requests one in office today as well. )  Started 6 days ago with what she thought was just allergies with sneezing and a bit of runny nose.  Worse in the last couple of days more congested and groggy, lots of head pressure only partially relieved by Tylenol .  Denies any fevers, chest pain, shortness of breath, or prominent cough.  Regarding headache-some light sensitivity but denies any vision loss, confusion, slurred speech, difficulty ambulating, vomiting She  just returned from the beach and all other travelers are feeling well. She usually takes oral antihistamine when she gets symptoms like this, but is currently out.  She tried an herbal supplement that usually works well but with no benefit.      ROS: Negative unless specifically indicated above in HPI.   Relevant past medical history reviewed and updated as indicated.   Allergies and medications reviewed and updated.   Current Outpatient Medications:    norelgestromin -ethinyl estradiol  (XULANE) 150-35 MCG/24HR transdermal patch, Place 1 patch onto the skin once a week., Disp: 12 patch, Rfl: 3   rizatriptan  (MAXALT ) 10 MG tablet, Take 1 tablet (10 mg total) by mouth as needed for migraine. May repeat in 2 hours if needed. No more than 30mg  in 24hrs., Disp: 10 tablet, Rfl: 0  Allergies  Allergen Reactions   Penicillins Rash    Client was able to take Amoxicillin  with no problem 04-2020    Objective:   BP 122/72   Pulse 71   Temp 98.2 F (36.8 C)   Resp 16   Ht 5' 8 (1.727 m)   Wt 198 lb (89.8 kg)   LMP 04/17/2024   SpO2 96%   BMI 30.11 kg/m    Physical Exam Constitutional:      Appearance: She is well-developed. She is not toxic-appearing.     Comments: Sitting in room in sunglasses due to HA  HENT:     Head: Normocephalic and atraumatic.     Right Ear: No middle ear effusion.  Tympanic membrane is retracted. Tympanic membrane is not erythematous.     Left Ear:  No middle ear effusion. Tympanic membrane is retracted. Tympanic membrane is not erythematous.     Nose: Mucosal edema present.     Right Turbinates: Swollen.     Left Turbinates: Swollen.     Mouth/Throat:     Lips: Pink.     Mouth: Mucous membranes are moist.     Pharynx: Postnasal drip present.     Tonsils: No tonsillar exudate.  Cardiovascular:     Rate and Rhythm: Normal rate and regular rhythm.     Heart sounds: Normal heart sounds.  Pulmonary:     Effort: Pulmonary effort is normal. No respiratory  distress.     Breath sounds: Normal breath sounds.  Musculoskeletal:     Cervical back: Neck supple.  Lymphadenopathy:     Cervical: No cervical adenopathy.  Neurological:     Mental Status: She is alert and oriented to person, place, and time.     GCS: GCS eye subscore is 4. GCS verbal subscore is 5. GCS motor subscore is 6.     Comments: Speech clear and appropriate. Normal gait  Psychiatric:        Mood and Affect: Mood normal.

## 2024-06-25 ENCOUNTER — Other Ambulatory Visit: Payer: Self-pay | Admitting: Emergency Medicine

## 2024-07-03 ENCOUNTER — Encounter: Payer: Self-pay | Admitting: Nurse Practitioner

## 2024-07-03 NOTE — Telephone Encounter (Signed)
 Autumn Morgan,   I have spoke with Autumn Morgan, she stated she is no longer feeling dizzy or light headed. She just wants to know should she come in to get evaluated? Please advise
